# Patient Record
Sex: Female | Born: 1980 | Hispanic: Yes | Marital: Married | Smoking: Never smoker
Health system: Southern US, Community
[De-identification: ages and names within clinical notes are randomized; demographics above are authoritative.]

## PROBLEM LIST (undated history)

## (undated) ENCOUNTER — Inpatient Hospital Stay (HOSPITAL_COMMUNITY): Payer: Self-pay

## (undated) DIAGNOSIS — A749 Chlamydial infection, unspecified: Secondary | ICD-10-CM

## (undated) DIAGNOSIS — K831 Obstruction of bile duct: Secondary | ICD-10-CM

## (undated) DIAGNOSIS — O26619 Liver and biliary tract disorders in pregnancy, unspecified trimester: Secondary | ICD-10-CM

## (undated) DIAGNOSIS — O26649 Intrahepatic cholestasis of pregnancy, unspecified trimester: Secondary | ICD-10-CM

## (undated) DIAGNOSIS — D649 Anemia, unspecified: Secondary | ICD-10-CM

## (undated) HISTORY — PX: COLPOSCOPY: SHX161

---

## 2009-11-29 ENCOUNTER — Ambulatory Visit (HOSPITAL_COMMUNITY): Admission: RE | Admit: 2009-11-29 | Discharge: 2009-11-29 | Payer: Self-pay | Admitting: Family Medicine

## 2009-12-25 ENCOUNTER — Inpatient Hospital Stay (HOSPITAL_COMMUNITY): Admission: RE | Admit: 2009-12-25 | Discharge: 2009-12-25 | Payer: Self-pay | Admitting: Family Medicine

## 2009-12-27 ENCOUNTER — Ambulatory Visit (HOSPITAL_COMMUNITY): Admission: RE | Admit: 2009-12-27 | Discharge: 2009-12-27 | Payer: Self-pay | Admitting: Family Medicine

## 2010-01-21 ENCOUNTER — Ambulatory Visit: Payer: Self-pay | Admitting: Family Medicine

## 2010-01-26 ENCOUNTER — Observation Stay (HOSPITAL_COMMUNITY): Admission: AD | Admit: 2010-01-26 | Discharge: 2010-01-26 | Payer: Self-pay | Admitting: Obstetrics and Gynecology

## 2010-01-27 ENCOUNTER — Inpatient Hospital Stay (HOSPITAL_COMMUNITY)
Admission: AD | Admit: 2010-01-27 | Discharge: 2010-01-30 | Payer: Self-pay | Source: Home / Self Care | Admitting: Obstetrics & Gynecology

## 2010-01-27 ENCOUNTER — Ambulatory Visit: Payer: Self-pay | Admitting: Advanced Practice Midwife

## 2010-04-27 ENCOUNTER — Encounter: Payer: Self-pay | Admitting: Family Medicine

## 2010-06-18 LAB — CBC
Hemoglobin: 13.8 g/dL (ref 12.0–15.0)
Hemoglobin: 14 g/dL (ref 12.0–15.0)
MCHC: 34 g/dL (ref 30.0–36.0)
MCHC: 34.5 g/dL (ref 30.0–36.0)
RBC: 4.54 MIL/uL (ref 3.87–5.11)
RBC: 4.67 MIL/uL (ref 3.87–5.11)

## 2010-06-18 LAB — ABO/RH: ABO/RH(D): O POS

## 2010-06-18 LAB — RPR
RPR Ser Ql: NONREACTIVE
RPR Ser Ql: NONREACTIVE

## 2012-11-21 LAB — OB RESULTS CONSOLE HIV ANTIBODY (ROUTINE TESTING): HIV: NONREACTIVE

## 2012-11-21 LAB — OB RESULTS CONSOLE ABO/RH: RH TYPE: POSITIVE

## 2012-11-21 LAB — OB RESULTS CONSOLE ANTIBODY SCREEN: Antibody Screen: NEGATIVE

## 2012-11-21 LAB — OB RESULTS CONSOLE RPR: RPR: NONREACTIVE

## 2012-11-21 LAB — OB RESULTS CONSOLE GC/CHLAMYDIA
Chlamydia: NEGATIVE
Gonorrhea: NEGATIVE

## 2012-11-21 LAB — OB RESULTS CONSOLE RUBELLA ANTIBODY, IGM: RUBELLA: IMMUNE

## 2012-11-21 LAB — OB RESULTS CONSOLE HEPATITIS B SURFACE ANTIGEN: HEP B S AG: NEGATIVE

## 2012-12-12 ENCOUNTER — Inpatient Hospital Stay (HOSPITAL_COMMUNITY)
Admission: AD | Admit: 2012-12-12 | Discharge: 2012-12-12 | Disposition: A | Discharge status: Home / Self Care | Payer: Self-pay | Source: Ambulatory Visit | Attending: Obstetrics & Gynecology | Admitting: Obstetrics & Gynecology

## 2012-12-12 ENCOUNTER — Encounter (HOSPITAL_COMMUNITY): Payer: Self-pay | Admitting: *Deleted

## 2012-12-12 DIAGNOSIS — T63461A Toxic effect of venom of wasps, accidental (unintentional), initial encounter: Secondary | ICD-10-CM | POA: Insufficient documentation

## 2012-12-12 DIAGNOSIS — T6391XA Toxic effect of contact with unspecified venomous animal, accidental (unintentional), initial encounter: Secondary | ICD-10-CM | POA: Insufficient documentation

## 2012-12-12 DIAGNOSIS — Y92009 Unspecified place in unspecified non-institutional (private) residence as the place of occurrence of the external cause: Secondary | ICD-10-CM | POA: Insufficient documentation

## 2012-12-12 HISTORY — DX: Chlamydial infection, unspecified: A74.9

## 2012-12-12 NOTE — MAU Note (Signed)
Pt was stung by a bee on left lower leg one hour ago; she called the health dept and was told to come to [facility] to be checked.   Pt applied ice and then aloe vera to site, says it still burns.

## 2012-12-12 NOTE — MAU Provider Note (Signed)
  History     CSN: [id number]  Arrival date and time: 12/12/12 1510   First Provider Initiated Contact with Patient 12/12/12 1657      No chief complaint on file.  HPI [name] is a 32 y.o. female G2P1001 who presents with complaints of a bee sting to her left lower leg. She is receiving her care at the health department and they recommended her to come here for evaluation. The pt has been keeping Ice on the sting and feels it is getting better. She denies shortness of breath, difficulty breathing, or swelling in the face/ hands. This is the first time she has ever been stung by a bee; it happened approximately 3 hours ago.  OB History   Grav Para Term Preterm Abortions TAB SAB Ect Mult Living   2 1 1       1       Past Medical History  Diagnosis Date  . Chlamydia     History reviewed. No pertinent past surgical history.  Family History  Problem Relation Age of Onset  . Heart disease Father   . Asthma Paternal Grandmother     History  Substance Use Topics  . Smoking status: Never Smoker   . Smokeless tobacco: Not on file  . Alcohol Use: No    Allergies:  Allergies  Allergen Reactions  . Penicillins Rash    Prescriptions prior to admission  Medication Sig Dispense Refill  . Aloe Vera GEL Apply 1 application topically once. Applied to bee sting on leg.      . Prenatal Vit-Fe Fumarate-FA (PRENATAL MULTIVITAMIN) TABS tablet Take 1 tablet by mouth daily at 12 noon.        Review of Systems  Constitutional: Negative for fever, chills and malaise/fatigue.       At bee sting site   HENT: Negative for sore throat.   Eyes: Negative for blurred vision.  Respiratory: Negative for shortness of breath.   Cardiovascular: Positive for leg swelling. Negative for chest pain.       Leg lower leg swelling at bee sting site   Gastrointestinal: Negative for nausea and vomiting.  Skin: Positive for itching. Negative for rash.  Neurological: Negative for  dizziness, weakness and headaches.   Physical Exam   Last menstrual period 09/19/2012.  Physical Exam  Constitutional: She appears well-developed and well-nourished. No distress.  Neck: Neck supple.  Cardiovascular: Normal rate, regular rhythm and normal heart sounds.   Respiratory: Effort normal and breath sounds normal. No respiratory distress.  Musculoskeletal:       Left lower leg: She exhibits tenderness and swelling. She exhibits no edema and no deformity.       Legs: 1+ swelling in left lower extermitie at bee sting site   Skin: She is not diaphoretic.    MAU Course  Procedures None  MDM None  Assessment and Plan  A: Bee sting of left lower extremity   P: Discharge home Ok to continue Ice to the sting area If needed, ok to take benadryl or Claritin as directed on the bottle  Return to MAU with worsening symptoms Keep your appointment with the health department  Support given  Debbrah Alar FNP-C 12/12/2012, 5:16 PM

## 2013-04-06 NOTE — L&D Delivery Note (Signed)
.    Delivery Note  Pt progressed to 8cms on 2 mu/min of pitocin.  AROM with clear fluid around 2210 At 2237, after a 2 minute 2nd stage,   a viable female was delivered via  (Presentation:LOA ).  APGAR:9/9.  The baby had respiratory effort and vigorous cry whilst it's body was still undelivered.  The weight is pending.  40 units of pitocin diluted in 1000cc LR was infused rapidly IV.  The placenta separated spontaneously and delivered via CCT and maternal pushing effort.  It was inspected and appears to be intact with a 3 VC.  There were the following complications:   Anesthesia:none Episiotomy: none Lacerations: none Suture Repair: n/a Est. Blood Loss (mL): 150cc  Mom to postpartum.  Baby to Couplet care / Skin to Skin.

## 2013-04-06 NOTE — L&D Delivery Note (Signed)
Attestation of Attending Supervision of Advanced Practitioner (CNM/NP): Evaluation and management procedures were performed by the Advanced Practitioner under my supervision and collaboration.  I have reviewed the Advanced Practitioner's note and chart, and I agree with the management and plan.  HARRAWAY-SMITH, Rochanda Harpham 2:51 PM     

## 2013-06-13 ENCOUNTER — Encounter (HOSPITAL_COMMUNITY): Payer: Self-pay

## 2013-06-13 ENCOUNTER — Inpatient Hospital Stay (HOSPITAL_COMMUNITY)
Admission: AD | Admit: 2013-06-13 | Discharge: 2013-06-14 | Disposition: A | Discharge status: Home / Self Care | Payer: Self-pay | Source: Ambulatory Visit | Attending: Obstetrics & Gynecology | Admitting: Obstetrics & Gynecology

## 2013-06-13 DIAGNOSIS — O9989 Other specified diseases and conditions complicating pregnancy, childbirth and the puerperium: Principal | ICD-10-CM

## 2013-06-13 DIAGNOSIS — A088 Other specified intestinal infections: Secondary | ICD-10-CM | POA: Insufficient documentation

## 2013-06-13 DIAGNOSIS — A084 Viral intestinal infection, unspecified: Secondary | ICD-10-CM

## 2013-06-13 DIAGNOSIS — R109 Unspecified abdominal pain: Secondary | ICD-10-CM | POA: Insufficient documentation

## 2013-06-13 DIAGNOSIS — O99891 Other specified diseases and conditions complicating pregnancy: Secondary | ICD-10-CM | POA: Insufficient documentation

## 2013-06-13 LAB — OB RESULTS CONSOLE GBS: STREP GROUP B AG: NEGATIVE

## 2013-06-13 NOTE — MAU Note (Signed)
Watery diarrhea since 2 pm yesterday, vomited x 2. Some upper abdominal pain, feels like baby "balls up" when this happens; about 5-6 times per hour tonight. Denies vaginal bleeding or LOF. Positive fetal movement.

## 2013-06-14 DIAGNOSIS — A088 Other specified intestinal infections: Secondary | ICD-10-CM

## 2013-06-14 LAB — URINE MICROSCOPIC-ADD ON

## 2013-06-14 LAB — URINALYSIS, ROUTINE W REFLEX MICROSCOPIC
BILIRUBIN URINE: NEGATIVE
GLUCOSE, UA: NEGATIVE mg/dL
Ketones, ur: NEGATIVE mg/dL
Leukocytes, UA: NEGATIVE
Nitrite: NEGATIVE
PROTEIN: NEGATIVE mg/dL
Urobilinogen, UA: 0.2 mg/dL (ref 0.0–1.0)
pH: 6 (ref 5.0–8.0)

## 2013-06-14 NOTE — Discharge Instructions (Signed)
Dieta para la diarrea - Adultos   (Diet for Diarrhea, Adult)   Las deposiciones acuosas frecuentes (diarrea) pueden ser originados o empeorar por algunos alimentos o bebidas. La diarrea puede mejorarse con un cambio en la dieta. Como la diarrea puede durar hasta 7 días, es fácil perder mucha cantidad de líquidos del organismo y deshidratarse. Los líquidos que se pierden deben reponerse. Junto con una dieta equilibrada y beber una gran cantidad de líquido para mantener la orina de tono claro o color amarillo pálido.  INSTRUCCIONES PARA LA DIETA   · Asegure una adecuada ingesta de líquidos (hidratación). Evite los líquidos que contengan azúcares simples o las bebidas deportivas, los jugos de frutas, los productos derivados de la leche entera y las gaseosas. Si bebe la cantidad suficiente de líquidos, la orina debe ser clara o amarillo pálido. Una solución de rehidratación oral se puede comprar en las farmacias, en las tiendas minoristas y por Internet. Se puede preparar una solución de rehidratación oral casera con los siguientes ingredientes:  ·    cucharadita de sal.  · ¾ cucharadita de bicarbonato.  ·  de cucharadita de sal sustituta (cloruro de potasio).  · 1  cucharada de azúcar.  · 1l (34 onzas) de agua.  · Ciertos alimentos y bebidas pueden aumentar la velocidad a la que el alimento progresa a través del tracto gastrointestinal (GI). Estos alimentos y bebidas deben evitarse e incluyen:  · Bebidas alcohólicas y con cafeína.  · Alimentos ricos en fibra, como frutas y verduras, nueces, semillas, panes y cereales integrales.  · Alimentos y bebidas endulzados con alcoholes de azúcar, tales como xilitol, sorbitol, y manitol.  · Algunos alimentos pueden ser bien tolerados y puede ayudar a espesar las heces, incluyendo:  · Alimentos con almidón, como arroz, pan, pasta, cereales bajos en azúcar, avena, sémola de maíz, papas al horno, galletas y panecillos.    · Bananas.    · Puré de manzana.  · Agregue alimentos ricos  en probióticos a la dieta del niño para ayudar a aumentar las bacterias saludables en el tracto gastrointestinal, como el yogur y productos lácteos fermentados.  ALIMENTOS Y BEBIDAS RECOMENDADOS   Féculas  Alimentos con menos de 2 g de fibra por porción.   · Recomendados:  Pan blanco, francés, pita, bollos y rosquillas. Muffins, pan ácimo. Galletas de agua, saladas o de graham. Pretzel, biscotes, bizcochos. Maíz refinado, trigo, arroz. Patatas preparadas de cualquier modo sin piel, macaroni, espaghetti, fideos, arroz refinado.  · Evite:  Pan, bollos o galletas preparadas con trigo entero, multigranos, salvado, semillas, frutos secos o coco. Tortillas de maíz o tacos. Cereales que contengan granos enteros, multigranos, salvado, coco, frutos secos o pasas de uva. Harina de avena cocida o seca. Cereales de grano grueso, granola. Cereales promocionados como con "alto contenido de fibra". Cáscara de patatas. Pasta integral, arroz marrón, arroz salvaje. Palomitas de maíz. Batatas. Panecillos dulces, donas, panqueques, waffles, pan Sela.  Vegetales  · Recomendados: Jugo de tomates o de vegetales. Vegetales bien cocidos o enlatados sin semillas. Frescos Lechuga tierna, pepino sin cáscara, repollos, espinacas, brotes de soja.  · Evite: Frescos, cocidos o enlatados: [place]ofas, porotos, remolacha, bróccoli, repollitos de [place]us[place], maíz, coles, legumbres, arvejas, batatas. Cocidos: Repollo verde o rojo, espinacas. Evite las porciones grandes de cualquier vegetal, debido a que los vegetales disminuyen su tamaño al cocinarlos y contienen más fibras por porción.  Frutas  · Recomendados: Cocidas o enlatadas: Duraznos, puré de manzanas, melón, cerezas, cóctel de frutas, pomelo, uvas, kiwi, naranjas, melocotón, pera, ciruelas,   sandías. Frescos: Manzanas sin la piel, banana madura, uvas, melón, cerezas, pomelo, duraznos, naranjas, ciruelas. Limite las porciones a ½ taza o1 unidad.  · Evite: Frescos: Manzana con piel, damasco, mango,  pera, frambuesa, frutillas. Jugo de ciruela, compota o ciruelas secas. Frutas secas, pasas de uva, dátiles. Evite porciones grandes de todas las frutas frescas.  Proteínas  · Recomendados: Carne molida o un bife tierno bien cocido, jamón, ternera, cordero, cerdo o aves. Huevos. pescado, ostra, langostinos, langosta, frutos de mar. Hígado y otros órganos.  · Evite: Carnes duras y fibrosas con cartílago. Mantequilla de maní, suave o entera. Queso, frutos secos, semillas, legumbres, arvejas secas, lentejas.  Lácteos:  · Recomendados: Yogur, leche sin lactosa, kéfir, yogur bebible, suero de leche, leche de soja, o queso duro común.  · Evite: Leche, leche con chocolate, bebidas a base de leche, tales como batidos.  Sopas  · Recomendados: Consomé, caldo o sopas hechas con los alimentos permitidos. Cualquier sopa colada.  · Evite: Sopas hechas con vegetales no permitidos, sopas basadas en cremas o leche.  Postres y dulces  · Recomendados: Gelatina sin azúcar, helados de agua sin azúcar.  · Evite: Tortas y masitas, pasteles hechos con frutas permitidas, budines, natillas, pasteles con crema. Gelatina, frutas, hielo, sorbetes, helados de agua. Helados, batidos sin frutos secos. Caramelos duros, miel, gelatina, melaza, jarabes, azúcar, jarabe de chocolate, pastillas de goma, malvaviscos.  Grasas y aceites  · Recomendados: Limite las grasas a menos de 8 cucharaditas por día.  · Evite: Semillas, frutos secos, aceitunas, paltas. Margarina, manteca, crema, mayonesa, aceites para ensaladas, aderezos para ensaladas. Salsas, tocino sin corteza.  Bebidas  · Recomendados: Agua, tés descafeinados, soluciones de rehidratación oral, bebidas sin azúcar no endulzados con alcoholes de azúcar.  · Evite: Los jugos de frutas, bebidas con cafeína (café, té, refrescos), bebidas alcohólicas, bebidas deportivas, o gaseosa lima-limón.  Condimentos  · Recomendados: Ketchup, mostaza, rábano picante, el vinagre, el cacao en polvo. Especias con  moderación: pimienta de [place], alb[place]aca, laurel, polvo u hojas de apio, canela, comino en polvo, polvo de curry, jengibre, macis, mejorana, cebolla o ajo en polvo, orégano, pimentón, perejil, pimienta, romero, salvia, ajedrea, estragón, tomillo, cúrcuma.  · Evite: Coco, miel.  Document Released: 03/23/2005 Document Revised: 12/16/2011  ExitCare® Patient Information ©2014 [employer].

## 2013-06-14 NOTE — MAU Provider Note (Signed)
Attestation of Attending Supervision of Advanced Practitioner (CNM/NP): Evaluation and management procedures were performed by the Advanced Practitioner under my supervision and collaboration.  I have reviewed the Advanced Practitioner's note and chart, and I agree with the management and plan.  HARRAWAY-SMITH, Marquise Lambson 6:49 AM     

## 2013-06-14 NOTE — MAU Provider Note (Signed)
History     CSN: [id number]  Arrival date and time: 06/13/13 2327   First Provider Initiated Contact with Patient 06/14/13 0017      Chief Complaint  Patient presents with  . Emesis  . Diarrhea  . Abdominal Pain   Emesis  Associated symptoms include abdominal pain and diarrhea.  Diarrhea  Associated symptoms include abdominal pain and vomiting.  Abdominal Pain Associated symptoms include diarrhea and vomiting.    Brittney Floyd is a 33 y.o. G2P1001 at [redacted]w[redacted]d who presents today with diarrhea. She states that it started yesterday at 1400, and she has had 8+ episodes since then. She denies any vomiting, but she has had some occasional abdominal pain. She states that the baby has been moving normally. She was seen at the HD on Monday and was 1cm at that visit.   Past Medical History  Diagnosis Date  . Chlamydia   . Medical history non-contributory     Past Surgical History  Procedure Laterality Date  . No past surgeries      Family History  Problem Relation Age of Onset  . Heart disease Father   . Asthma Paternal Grandmother     History  Substance Use Topics  . Smoking status: Never Smoker   . Smokeless tobacco: Not on file  . Alcohol Use: No    Allergies:  Allergies  Allergen Reactions  . Penicillins Rash    Prescriptions prior to admission  Medication Sig Dispense Refill  . Prenatal Vit-Fe Fumarate-FA (PRENATAL MULTIVITAMIN) TABS tablet Take 1 tablet by mouth daily at 12 noon.      . Aloe Vera GEL Apply 1 application topically once. Applied to bee sting on leg.        Review of Systems  Gastrointestinal: Positive for vomiting, abdominal pain and diarrhea.   Physical Exam   Blood pressure 127/75, pulse 83, temperature 98 F (36.7 C), temperature source Oral, resp. rate 18, height 5\' 1"  (1.549 m), weight 75.751 kg (167 lb), last menstrual period 09/19/2012.  Physical Exam  Nursing note and vitals reviewed. Constitutional: She is oriented to  person, place, and time. She appears well-developed and well-nourished. No distress.  Cardiovascular: Normal rate.   Respiratory: Effort normal.  GI: Soft. There is no tenderness.  Genitourinary:   Cervix: 1/thick/-2/soft  Neurological: She is alert and oriented to person, place, and time.  Skin: Skin is warm and dry.  Psychiatric: She has a normal mood and affect.    MAU Course  Procedures  Results for orders placed during the hospital encounter of 06/13/13 (from the past 24 hour(s))  URINALYSIS, ROUTINE W REFLEX MICROSCOPIC     Status: Abnormal   Collection Time    06/13/13 11:37 PM      Result Value Ref Range   Color, Urine YELLOW  YELLOW   APPearance CLEAR  CLEAR   Specific Gravity, Urine <1.005 (*) 1.005 - 1.030   pH 6.0  5.0 - 8.0   Glucose, UA NEGATIVE  NEGATIVE mg/dL   Hgb urine dipstick TRACE (*) NEGATIVE   Bilirubin Urine NEGATIVE  NEGATIVE   Ketones, ur NEGATIVE  NEGATIVE mg/dL   Protein, ur NEGATIVE  NEGATIVE mg/dL   Urobilinogen, UA 0.2  0.0 - 1.0 mg/dL   Nitrite NEGATIVE  NEGATIVE   Leukocytes, UA NEGATIVE  NEGATIVE  URINE MICROSCOPIC-ADD ON     Status: Abnormal   Collection Time    06/13/13 11:37 PM      Result Value Ref Range  Squamous Epithelial / LPF RARE  RARE   WBC, UA 0-2  <3 WBC/hpf   RBC / HPF 0-2  <3 RBC/hpf   Bacteria, UA FEW (*) RARE     Assessment and Plan   1. Viral gastroenteritis    BRAT diet Labor precautions Fetal kick counts Return to MAU as needed  Follow-up Information   [facility] [place]. (As scheduled)    Contact information:   [address] [place] [id number] [phone]       Tawnya CrookHogan, Heather Donovan 06/14/2013, 12:29 AM

## 2013-06-15 ENCOUNTER — Encounter (HOSPITAL_COMMUNITY): Payer: Self-pay | Admitting: *Deleted

## 2013-06-15 ENCOUNTER — Inpatient Hospital Stay (HOSPITAL_COMMUNITY)
Admission: AD | Admit: 2013-06-15 | Discharge: 2013-06-15 | Disposition: A | Discharge status: Home / Self Care | Payer: Self-pay | Source: Ambulatory Visit | Attending: Obstetrics & Gynecology | Admitting: Obstetrics & Gynecology

## 2013-06-15 DIAGNOSIS — R51 Headache: Secondary | ICD-10-CM | POA: Insufficient documentation

## 2013-06-15 DIAGNOSIS — K5289 Other specified noninfective gastroenteritis and colitis: Secondary | ICD-10-CM

## 2013-06-15 DIAGNOSIS — K529 Noninfective gastroenteritis and colitis, unspecified: Secondary | ICD-10-CM

## 2013-06-15 DIAGNOSIS — O9989 Other specified diseases and conditions complicating pregnancy, childbirth and the puerperium: Principal | ICD-10-CM

## 2013-06-15 DIAGNOSIS — O99891 Other specified diseases and conditions complicating pregnancy: Secondary | ICD-10-CM | POA: Insufficient documentation

## 2013-06-15 DIAGNOSIS — A088 Other specified intestinal infections: Secondary | ICD-10-CM | POA: Insufficient documentation

## 2013-06-15 LAB — URINALYSIS, ROUTINE W REFLEX MICROSCOPIC
BILIRUBIN URINE: NEGATIVE
Glucose, UA: NEGATIVE mg/dL
Hgb urine dipstick: NEGATIVE
KETONES UR: NEGATIVE mg/dL
LEUKOCYTES UA: NEGATIVE
NITRITE: NEGATIVE
Protein, ur: NEGATIVE mg/dL
Specific Gravity, Urine: 1.02 (ref 1.005–1.030)
UROBILINOGEN UA: 0.2 mg/dL (ref 0.0–1.0)
pH: 6 (ref 5.0–8.0)

## 2013-06-15 MED ORDER — PROMETHAZINE HCL 12.5 MG PO TABS: 12.5000 mg | ORAL_TABLET | Freq: Four times a day (QID) | ORAL | Status: DC | PRN

## 2013-06-15 MED ORDER — LOPERAMIDE HCL 2 MG PO CAPS: 2.0000 mg | ORAL_CAPSULE | ORAL | Status: DC | PRN

## 2013-06-15 NOTE — Discharge Instructions (Signed)
Gastroenteritis viral  (Viral Gastroenteritis)  La gastroenteritis viral también es conocida como gripe del estómago. Este trastorno afecta el estómago y el tubo digestivo. Puede causar diarrea y vómitos repentinos. La enfermedad generalmente dura entre 3 y 8 días. La mayoría de las personas desarrolla una respuesta inmunológica. Con el tiempo, esto elimina el virus. Mientras se desarrolla esta respuesta natural, el virus puede afectar en forma importante su salud.   CAUSAS  Muchos virus diferentes pueden causar gastroenteritis, por ejemplo el rotavirus o el norovirus. Estos virus pueden contagiarse al consumir alimentos o agua contaminados. También puede contagiarse al compartir utensilios u otros artículos personales con una persona infectada o al tocar una superficie contaminada.   SÍNTOMAS  Los síntomas más comunes son diarrea y vómitos. Estos problemas pueden causar una pérdida grave de líquidos corporales(deshidratación) y un desequilibrio de sales corporales(electrolitos). Otros síntomas pueden ser:   · Fiebre.  · Dolor de cabeza.  · Fatiga.  · Dolor abdominal.  DIAGNÓSTICO   El médico podrá hacer el diagnóstico de gastroenteritis viral basándose en los síntomas y el examen físico También pueden tomarle una muestra de materia fecal para diagnosticar la presencia de virus u otras infecciones.   TRATAMIENTO  Esta enfermedad generalmente desaparece sin tratamiento. Los tratamientos están dirigidos a la rehidratación. Los casos más graves de gastroenteritis viral implican vómitos tan intensos que no es posible retener líquidos. En estos casos, los líquidos deben administrarse a través de una vía intravenosa (IV).   INSTRUCCIONES PARA EL CUIDADO DOMICILIARIO  · Beba suficientes líquidos para mantener la orina clara o de color amarillo pálido. Beba pequeñas cantidades de líquido con frecuencia y aumente la cantidad según la tolerancia.  · Pida instrucciones específicas a su médico con respecto a la  rehidratación.  · Evite:  · Alimentos que tengan mucha azúcar.  · Alcohol.  · Gaseosas.  · Tabaco.  · Jugos.  · Bebidas con cafeína.  · Líquidos muy calientes o fríos.  · Alimentos muy grasos.  · Comer demasiado a la vez.  · Productos lácteos hasta 24 a 48 horas después de que se detenga la diarrea.  · Puede consumir probióticos. Los probióticos son cultivos activos de bacterias beneficiosas. Pueden disminuir la cantidad y el número de deposiciones diarreicas en el adulto. Se encuentran en los yogures con cultivos activos y en los suplementos.  · Lave bien sus manos para evitar que se disemine el virus.  · Sólo tome medicamentos de venta libre o recetados para calmar el dolor, las molestias o bajar la fiebre según las indicaciones de su médico. No administre aspirina a los niños. Los medicamentos antidiarreicos no son recomendables.  · Consulte a su médico si puede seguir tomando sus medicamentos recetados o de venta libre.  · Cumpla con todas las visitas de control, según le indique su médico.  SOLICITE ATENCIÓN MÉDICA DE INMEDIATO SI:  · No puede retener líquidos.  · No hay emisión de orina durante 6 a 8 horas.  · Le falta el aire.  · Observa sangre en el vómito (se ve como café molido) o en la materia fecal.  · Siente dolor abdominal que empeora o se concentra en una zona pequeña (se localiza).  · Tiene náuseas o vómitos persistentes.  · Tiene fiebre.  · El paciente es un niño menor de 3 meses y tiene fiebre.  · El paciente es un niño mayor de 3 meses, tiene fiebre y síntomas persistentes.  · El paciente es un niño mayor de 3 meses   y tiene fiebre y síntomas que empeoran repentinamente.  · El paciente es un bebé y no tiene lágrimas cuando llora.  ASEGÚRESE QUE:   · Comprende estas instrucciones.  · Controlará su enfermedad.  · Solicitará ayuda inmediatamente si no mejora o si empeora.  Document Released: 03/23/2005 Document Revised: 06/15/2011  ExitCare® Patient Information ©2014 [employer].

## 2013-06-15 NOTE — MAU Provider Note (Signed)
History     CSN: [id number]  Arrival date and time: 06/15/13 1422   None     No chief complaint on file.  HPI  Pt is a 33 yo G2P1001 currently pregnant female at [redacted]w[redacted]d presenting with 3 days of consistent diarrhea and some mild abdominal pain. Pt relates that for the past three days she has been experiencing profuse, watery, non-bloody diarrhea. Pt was evaluated for this, 2 days ago in the MAU and was diagnosed with viral gastroenteritis and discharged with instructions to be on the BRAT diet. At this time she had been experiencing emesis which has since resolved. Pt has been compliant with the BRAT diet but states that she has experienced continued diarrhea. She has taken no medications.  Pt states that she has developed some mild waxing and waning upper abdominal pains over this time as well. Pt does not feel like these are contractions. [facility] today and was advised to come to the MAU for evaluation of this.  Pt has no sick contacts. She admits to eating a "[place] sandwich" that others had not eaten prior to the onset of symptoms, and feels that this is what caused her illness. Pt reports only a mild headache for the past day, but no visual changes. No edema. No cough, myalgias, or nasal congestions.  She has no vaginal bleeding or fluid leakage. Fetal movement is present, however she states that it has been less since she has been ill.   Past Medical History  Diagnosis Date  . Chlamydia   . Medical history non-contributory     Past Surgical History  Procedure Laterality Date  . No past surgeries      Family History  Problem Relation Age of Onset  . Heart disease Father   . Asthma Paternal Grandmother     History  Substance Use Topics  . Smoking status: Never Smoker   . Smokeless tobacco: Not on file  . Alcohol Use: No    Allergies:  Allergies  Allergen Reactions  . Penicillins Rash    Prescriptions prior to admission  Medication Sig  Dispense Refill  . Prenatal Vit-Fe Fumarate-FA (PRENATAL MULTIVITAMIN) TABS tablet Take 1 tablet by mouth daily at 12 noon.        Review of Systems  Constitutional: Negative for fever, chills and malaise/fatigue.  HENT: Negative for congestion and sore throat.   Eyes: Negative for blurred vision and double vision.  Respiratory: Negative for cough, sputum production and shortness of breath.   Cardiovascular: Negative for chest pain and palpitations.  Gastrointestinal: Positive for abdominal pain and diarrhea. Negative for heartburn and nausea.  Genitourinary: Negative for dysuria and urgency.  Musculoskeletal: Negative for myalgias.  Neurological: Positive for headaches. Negative for dizziness, tingling and weakness.   Physical Exam   Last menstrual period 09/19/2012.  Physical Exam  Constitutional: She appears well-developed and well-nourished. No distress.  Cardiovascular: Normal rate, regular rhythm and normal heart sounds.   Respiratory: Effort normal and breath sounds normal. No respiratory distress. She has no wheezes. She has no rales.  GI: Soft. There is no tenderness. There is no rebound and no guarding.   FHT: 140s mod var mult 15x15 accels, no decels Toco: no ctx  MAU Course  Procedures  MDM Stable vitals, tolerating PO. No labs today  Assessment and Plan  Brittney Floyd is a 33 y.o. G2P1001 at [redacted]w[redacted]d with acute viral gastroenteritis. Will tx symptomatically with immodium and phenergan. APAP for pain. Pt  feels to be improving. Discharge home with return precautions for blood in stool, inability to tolerate PO, or fever. Reassuring and reactive fetal status. F/u with HD on monday  Winkel, Bradley T 06/15/2013, 3:30 PM   I spoke with and examined patient and agree with PA-S's note and plan of care.  Tawana ScaleMichael Ryan Zacharias Ridling, MD Ob Fellow 06/15/2013 4:25 PM

## 2013-06-15 NOTE — MAU Note (Signed)
Vomiting & diarrhea started Monday, seen in MAU on Tuesday night, sx's continue.  Pain in upper abdomen.  Denies fever, cough, or sore throat.

## 2013-06-16 ENCOUNTER — Telehealth (HOSPITAL_COMMUNITY): Payer: Self-pay | Admitting: *Deleted

## 2013-06-16 NOTE — MAU Provider Note (Signed)
Attestation of Attending Supervision of Advanced Practitioner (PA/CNM/NP): Evaluation and management procedures were performed by the Advanced Practitioner under my supervision and collaboration.  I have reviewed the Advanced Practitioner's note and chart, and I agree with the management and plan.  Royale Swamy, MD, FACOG Attending Obstetrician & Gynecologist Faculty Practice, [facility]  

## 2013-06-26 ENCOUNTER — Other Ambulatory Visit (HOSPITAL_COMMUNITY): Payer: Self-pay | Admitting: Nurse Practitioner

## 2013-06-26 DIAGNOSIS — O480 Post-term pregnancy: Secondary | ICD-10-CM

## 2013-06-29 ENCOUNTER — Telehealth (HOSPITAL_COMMUNITY): Payer: Self-pay | Admitting: *Deleted

## 2013-06-29 ENCOUNTER — Inpatient Hospital Stay (HOSPITAL_COMMUNITY)
Admission: AD | Admit: 2013-06-29 | Discharge: 2013-06-30 | DRG: 775 | Disposition: A | Discharge status: Home / Self Care | Payer: Medicaid Other | Source: Ambulatory Visit | Attending: Obstetrics & Gynecology | Admitting: Obstetrics & Gynecology

## 2013-06-29 ENCOUNTER — Encounter (HOSPITAL_COMMUNITY): Payer: Self-pay | Admitting: *Deleted

## 2013-06-29 DIAGNOSIS — K838 Other specified diseases of biliary tract: Secondary | ICD-10-CM | POA: Diagnosis present

## 2013-06-29 DIAGNOSIS — O9989 Other specified diseases and conditions complicating pregnancy, childbirth and the puerperium: Secondary | ICD-10-CM

## 2013-06-29 DIAGNOSIS — O26613 Liver and biliary tract disorders in pregnancy, third trimester: Secondary | ICD-10-CM

## 2013-06-29 DIAGNOSIS — O99892 Other specified diseases and conditions complicating childbirth: Secondary | ICD-10-CM

## 2013-06-29 DIAGNOSIS — K831 Obstruction of bile duct: Secondary | ICD-10-CM | POA: Diagnosis present

## 2013-06-29 LAB — CBC
HCT: 36.4 % (ref 36.0–46.0)
Hemoglobin: 12.3 g/dL (ref 12.0–15.0)
MCH: 26.6 pg (ref 26.0–34.0)
MCHC: 33.8 g/dL (ref 30.0–36.0)
MCV: 78.6 fL (ref 78.0–100.0)
Platelets: 295 10*3/uL (ref 150–400)
RBC: 4.63 MIL/uL (ref 3.87–5.11)
RDW: 13.6 % (ref 11.5–15.5)
WBC: 12 10*3/uL — ABNORMAL HIGH (ref 4.0–10.5)

## 2013-06-29 LAB — TYPE AND SCREEN
ABO/RH(D): O POS
Antibody Screen: NEGATIVE

## 2013-06-29 LAB — RPR: RPR Ser Ql: NONREACTIVE

## 2013-06-29 MED ORDER — OXYCODONE-ACETAMINOPHEN 5-325 MG PO TABS: 1.0000 | ORAL_TABLET | ORAL | Status: DC | PRN

## 2013-06-29 MED ORDER — CITRIC ACID-SODIUM CITRATE 334-500 MG/5ML PO SOLN: 30.0000 mL | ORAL | Status: DC | PRN

## 2013-06-29 MED ORDER — FENTANYL CITRATE 0.05 MG/ML IJ SOLN: 50.0000 ug | INTRAMUSCULAR | Status: DC | PRN

## 2013-06-29 MED ORDER — TERBUTALINE SULFATE 1 MG/ML IJ SOLN: 0.2500 mg | Freq: Once | INTRAMUSCULAR | Status: AC | PRN

## 2013-06-29 MED ORDER — LACTATED RINGERS IV SOLN: 500.0000 mL | INTRAVENOUS | Status: DC | PRN

## 2013-06-29 MED ORDER — OXYTOCIN 40 UNITS IN LACTATED RINGERS INFUSION - SIMPLE MED
1.0000 m[IU]/min | INTRAVENOUS | Status: DC
Start: 2013-06-29 — End: 2013-06-30
  Administered 2013-06-29: 2 m[IU]/min via INTRAVENOUS
  Filled 2013-06-29: qty 1000

## 2013-06-29 MED ORDER — MISOPROSTOL 25 MCG QUARTER TABLET
25.0000 ug | ORAL_TABLET | ORAL | Status: DC | PRN
  Administered 2013-06-29 (×2): 25 ug via VAGINAL
  Filled 2013-06-29 (×2): qty 0.25

## 2013-06-29 MED ORDER — OXYTOCIN BOLUS FROM INFUSION: 500.0000 mL | INTRAVENOUS | Status: DC

## 2013-06-29 MED ORDER — IBUPROFEN 600 MG PO TABS: 600.0000 mg | ORAL_TABLET | Freq: Four times a day (QID) | ORAL | Status: DC | PRN

## 2013-06-29 MED ORDER — OXYTOCIN 40 UNITS IN LACTATED RINGERS INFUSION - SIMPLE MED: 62.5000 mL/h | INTRAVENOUS | Status: DC

## 2013-06-29 MED ORDER — LACTATED RINGERS IV SOLN
INTRAVENOUS | Status: DC
  Administered 2013-06-29: 125 mL/h via INTRAVENOUS

## 2013-06-29 MED ORDER — HYDROXYZINE HCL 25 MG PO TABS
25.0000 mg | ORAL_TABLET | Freq: Four times a day (QID) | ORAL | Status: DC | PRN
  Filled 2013-06-29: qty 1

## 2013-06-29 MED ORDER — LIDOCAINE HCL (PF) 1 % IJ SOLN
30.0000 mL | INTRAMUSCULAR | Status: DC | PRN
  Filled 2013-06-29: qty 30

## 2013-06-29 MED ORDER — ONDANSETRON HCL 4 MG/2ML IJ SOLN: 4.0000 mg | Freq: Four times a day (QID) | INTRAMUSCULAR | Status: DC | PRN

## 2013-06-29 MED ORDER — ACETAMINOPHEN 325 MG PO TABS: 650.0000 mg | ORAL_TABLET | ORAL | Status: DC | PRN

## 2013-06-29 NOTE — Progress Notes (Signed)
Brittney Floyd is a 33 y.o. G2P1001 at 5965w3d by LMP admitted for induction of labor due to cholestasis (bile acids = 25).  Subjective: Doing well; no complaints. Starting to feel some ctxs.   Objective: BP 125/80  Pulse 65  Temp(Src) 97.9 F (36.6 C) (Oral)  Resp 16  Ht 5\' 1"  (1.549 m)  Wt 77.111 kg (170 lb)  BMI 32.14 kg/m2  LMP 09/19/2012     FHT:  FHR: 150 bpm, variability: moderate,  accelerations:  Present,  decelerations:  Absent UC:   irregular SVE:   Dilation: 2 Effacement (%): 60 Station: -2 Exam by:: S Grindstaff RN  Labs: Lab Results  Component Value Date   WBC 12.0* 06/29/2013   HGB 12.3 06/29/2013   HCT 36.4 06/29/2013   MCV 78.6 06/29/2013   PLT 295 06/29/2013   Assessment / Plan: induction of labor due to cholestasis (bile acids = 25).  Labor: Progressing normally and FB placed @ 7pm s/p Cytotec x 2; Will start Pitocin in 4 hours Preeclampsia:  na Fetal Wellbeing:  Category I Pain Control:  Labor support without medications I/D:  GBS neg Anticipated MOD:  NSVD  Brittney Floyd, Brittney Floyd 06/29/2013, 6:57 PM

## 2013-06-29 NOTE — Progress Notes (Signed)
Maternal abdomin has little tone making it difficult to trace fhr.  [facility] applied

## 2013-06-29 NOTE — H&P (Signed)
LABOR ADMISSION HISTORY AND PHYSICAL   Brittney Floyd is a 33 y.o. female G2P1001 with IUP at 6143w3d by LMP c/w 19 week US presenting for IOL for cholestasis. Pt has been doing well throughout pregnancy and was seen for routine visit three days ago and complained of itching so bile salts were checked and were elevated at 25. Had had itching for 2 weeks prior to this. Thus, she was sent over here for IOL. No ctx, lof, vb. +FM   [facility]are at [facility] since early pregnancy.  Pregnancy uncomplicated up until this visit and the elevated bile salts. GBS neg. Normal PNL. US normal at Dr. Elsie StainMarshall's office.   Planning to breast feed and wanting depo for contraception.    Prenatal History/Complications:  Past Medical History: Past Medical History  Diagnosis Date  . Chlamydia   . Medical history non-contributory     Past Surgical History: Past Surgical History  Procedure Laterality Date  . No past surgeries      Obstetrical History: OB History   Grav Para Term Preterm Abortions TAB SAB Ect Mult Living   2 1 1       1      G1- NSVD, 5lb 8oz  Social History: History   Social History  . Marital Status: Married    Spouse Name: N/A    Number of Children: N/A  . Years of Education: N/A   Social History Main Topics  . Smoking status: Never Smoker   . Smokeless tobacco: None  . Alcohol Use: No  . Drug Use: No  . Sexual Activity: Yes   Other Topics Concern  . None   Social History Narrative  . None    Family History: Family History  Problem Relation Age of Onset  . Heart disease Father   . Asthma Paternal Grandmother     Allergies: Allergies  Allergen Reactions  . Penicillins Rash    Prescriptions prior to admission  Medication Sig Dispense Refill  . loperamide (IMODIUM) 2 MG capsule Take 1 capsule (2 mg total) by mouth as needed for diarrhea or loose stools.  30 capsule  0  . Prenatal Vit-Fe Fumarate-FA (PRENATAL MULTIVITAMIN) TABS tablet Take 1 tablet by mouth  daily at 12 noon.      . promethazine (PHENERGAN) 12.5 MG tablet Take 1 tablet (12.5 mg total) by mouth every 6 (six) hours as needed for nausea or vomiting.  30 tablet  0     Review of Systems  All systems reviewed and negative except as stated in HPI    Blood pressure 117/82, pulse 96, temperature 98.4 F (36.9 C), temperature source Oral, resp. rate 18, height 5\' 1"  (1.549 m), weight 77.111 kg (170 lb), last menstrual period 09/19/2012. General appearance: alert, cooperative and no distress Lungs: clear to auscultation bilaterally Heart: regular rate and rhythm Abdomen: soft, non-tender; bowel sounds normal Extremities: Homans sign is negative, no sign of DVT  Presentation: cephalic Fetal monitoringBaseline: 150s bpm, Variability: Good {> 6 bpm), Accelerations: Reactive and Decelerations: Absent Uterine activity irritability   Dilation: 2 Effacement (%): 30 Station: -2 Exam by:: Dr Gayla DossJoyner   Prenatal labs: ABO, Rh: O/Positive/-- (08/18 0000) Antibody: Negative (08/18 0000) Rubella:   RPR: Nonreactive (08/18 0000)  HBsAg: Negative (08/18 0000)  HIV: Non-reactive (08/18 0000)  GBS: Negative (03/10 0000)  1 hr Glucola 79 Genetic screening neg Anatomy US     Assessment: Brittney Floyd is a 33 y.o. G2P1001 with an IUP at 4643w3d presenting for IOL  at term for cholestasis   Plan: 1) admit to L&D - routine orders.  - planning natural child birth  2) IOL for cholestasis - bishops of 4 - start with cytotec and then likely FB and plan for pit after  3) FWB - cat I tracing -GBS neg - efw 6.5-7lbs  4) anticipate SVD - breast feeding - depo for contraception   Carr Shartzer L, MD 06/29/2013, 1:02 PM

## 2013-06-29 NOTE — H&P (Signed)
Attestation of Attending Supervision of Fellow: Evaluation and management procedures were performed by the Fellow under my supervision and collaboration.  I have reviewed the Fellow's note and chart, and I agree with the management and plan.    

## 2013-06-29 NOTE — Progress Notes (Signed)
   Brittney Floyd is a 33 y.o. G2P1001 at 6776w3d  admitted for induction of labor due to cholestasis.  Subjective: Not itching now.  Feeling contractions as mild  Objective: BP 125/80  Pulse 65  Temp(Src) 98.2 F (36.8 C) (Oral)  Resp 16  Ht 5\' 1"  (1.549 m)  Wt 77.111 kg (170 lb)  BMI 32.14 kg/m2  LMP 09/19/2012    FHT:  FHR: 150 bpm, variability: moderate,  accelerations:  Present,  decelerations:  Absent UC:   irregular, every 3-6 minutes SVE:   Dilation: 5.5 Effacement (%): 80 Station: -2 Exam by:: Brittney Floyd, CNM   Labs: Lab Results  Component Value Date   WBC 12.0* 06/29/2013   HGB 12.3 06/29/2013   HCT 36.4 06/29/2013   MCV 78.6 06/29/2013   PLT 295 06/29/2013    Assessment / Plan: IOL for cholestasis, ripening phase complete WIll start pit at 2130 (4 hours after cytotec) Labor: no Fetal Wellbeing:  Category I Pain Control:  none Anticipated MOD:  NSVD  Floyd,Brittney Biondo 06/29/2013, 8:58 PM

## 2013-06-30 ENCOUNTER — Ambulatory Visit (HOSPITAL_COMMUNITY): Admission: RE | Admit: 2013-06-30 | Payer: Self-pay | Source: Ambulatory Visit

## 2013-06-30 MED ORDER — FLEET ENEMA 7-19 GM/118ML RE ENEM: 1.0000 | ENEMA | Freq: Every day | RECTAL | Status: DC | PRN

## 2013-06-30 MED ORDER — METHYLERGONOVINE MALEATE 0.2 MG/ML IJ SOLN: 0.2000 mg | INTRAMUSCULAR | Status: DC | PRN

## 2013-06-30 MED ORDER — BENZOCAINE-MENTHOL 20-0.5 % EX AERO: 1.0000 "application " | INHALATION_SPRAY | CUTANEOUS | Status: DC | PRN

## 2013-06-30 MED ORDER — OXYTOCIN 40 UNITS IN LACTATED RINGERS INFUSION - SIMPLE MED: 62.5000 mL/h | INTRAVENOUS | Status: DC | PRN

## 2013-06-30 MED ORDER — TETANUS-DIPHTH-ACELL PERTUSSIS 5-2.5-18.5 LF-MCG/0.5 IM SUSP: 0.5000 mL | Freq: Once | INTRAMUSCULAR | Status: DC

## 2013-06-30 MED ORDER — SIMETHICONE 80 MG PO CHEW: 80.0000 mg | CHEWABLE_TABLET | ORAL | Status: DC | PRN

## 2013-06-30 MED ORDER — DIPHENHYDRAMINE HCL 25 MG PO CAPS: 25.0000 mg | ORAL_CAPSULE | Freq: Four times a day (QID) | ORAL | Status: DC | PRN

## 2013-06-30 MED ORDER — PRENATAL MULTIVITAMIN CH
1.0000 | ORAL_TABLET | Freq: Every day | ORAL | Status: DC
  Administered 2013-06-30: 1 via ORAL
  Filled 2013-06-30: qty 1

## 2013-06-30 MED ORDER — MEASLES, MUMPS & RUBELLA VAC ~~LOC~~ INJ
0.5000 mL | INJECTION | Freq: Once | SUBCUTANEOUS | Status: DC
  Filled 2013-06-30: qty 0.5

## 2013-06-30 MED ORDER — LANOLIN HYDROUS EX OINT: TOPICAL_OINTMENT | CUTANEOUS | Status: DC | PRN

## 2013-06-30 MED ORDER — OXYCODONE-ACETAMINOPHEN 5-325 MG PO TABS: 1.0000 | ORAL_TABLET | ORAL | Status: DC | PRN

## 2013-06-30 MED ORDER — WITCH HAZEL-GLYCERIN EX PADS: 1.0000 "application " | MEDICATED_PAD | CUTANEOUS | Status: DC | PRN

## 2013-06-30 MED ORDER — ZOLPIDEM TARTRATE 5 MG PO TABS: 5.0000 mg | ORAL_TABLET | Freq: Every evening | ORAL | Status: DC | PRN

## 2013-06-30 MED ORDER — FERROUS SULFATE 325 (65 FE) MG PO TABS
325.0000 mg | ORAL_TABLET | Freq: Two times a day (BID) | ORAL | Status: DC
  Administered 2013-06-30: 325 mg via ORAL
  Filled 2013-06-30: qty 1

## 2013-06-30 MED ORDER — BISACODYL 10 MG RE SUPP
10.0000 mg | Freq: Every day | RECTAL | Status: DC | PRN
Start: 2013-06-30 — End: 2013-06-30

## 2013-06-30 MED ORDER — ONDANSETRON HCL 4 MG/2ML IJ SOLN: 4.0000 mg | INTRAMUSCULAR | Status: DC | PRN

## 2013-06-30 MED ORDER — ONDANSETRON HCL 4 MG PO TABS: 4.0000 mg | ORAL_TABLET | ORAL | Status: DC | PRN

## 2013-06-30 MED ORDER — IBUPROFEN 600 MG PO TABS
600.0000 mg | ORAL_TABLET | Freq: Four times a day (QID) | ORAL | Status: DC
  Administered 2013-06-30 (×2): 600 mg via ORAL
  Filled 2013-06-30 (×2): qty 1

## 2013-06-30 MED ORDER — DIBUCAINE 1 % RE OINT: 1.0000 "application " | TOPICAL_OINTMENT | RECTAL | Status: DC | PRN

## 2013-06-30 MED ORDER — METHYLERGONOVINE MALEATE 0.2 MG PO TABS: 0.2000 mg | ORAL_TABLET | ORAL | Status: DC | PRN

## 2013-06-30 MED ORDER — SENNOSIDES-DOCUSATE SODIUM 8.6-50 MG PO TABS
2.0000 | ORAL_TABLET | ORAL | Status: DC
  Administered 2013-06-30: 2 via ORAL
  Filled 2013-06-30: qty 2

## 2013-06-30 MED ORDER — IBUPROFEN 600 MG PO TABS: 600.0000 mg | ORAL_TABLET | Freq: Four times a day (QID) | ORAL | Status: DC

## 2013-06-30 NOTE — Discharge Instructions (Signed)
Cuidados en el postparto luego de un parto vaginal  (Postpartum Care After Vaginal Delivery) Despus del parto (perodo de postparto), la estada normal en el hospital es de 24-72 horas. Si hubo problemas con el trabajo de parto o el parto, o si tiene otros problemas mdicos, es posible que Patent attorney en el hospital por ms [place].  Mientras est en el hospital, recibir [place] e instrucciones sobre cmo cuidar de usted misma y de su beb recin nacido durante el postparto.  Mientras est en el hospital:   Asegrese de decirle a las enfermeras si siente dolor o Tree surgeon, as como donde Designer, television/film set y Architect.  Si usted tuvo una incisin cerca de la vagina (episiotoma) o si ha tenido Education officer, museum, las enfermeras le pondrn hielo sobre la episiotoma o Psychiatrist. Las bolsas de hielo pueden ayudar a Dietitian y la hinchazn.  Si est amamantando, puede sentir contracciones dolorosas en el tero durante algunas semanas. Esto es normal. Las contracciones ayudan a que el tero vuelva a su tamao normal.  Es normal tener algo de sangrado despus del [place].  Durante los primeros 1-3 das despus del parto, el flujo es de color rojo y la cantidad puede ser similar a un perodo.  Es frecuente que el flujo se inicie y se Production assistant, radio.  En los primeros [place], puede eliminar algunos cogulos pequeos. Informe a las enfermeras si elimina cogulos grandes o aumenta el flujo.  No  elimine los cogulos de sangre por el inodoro antes de que la [employer] vea.  Durante los prximos 3 a [address] despus del parto, el flujo debe ser ms acuoso y rosado o Forensic psychologist.  Chancy Hurter a catorce [employer] del parto, el flujo debe ser una pequea cantidad de secrecin de color blanco amarillento.  La cantidad de flujo disminuir en las primeras semanas despus del parto. El flujo puede detenerse en 6-8 semanas. La mayora de las mujeres no tienen ms flujo a las 12 semanas  despus del [place].  Usted debe cambiar sus apsitos con frecuencia.  Lvese bien las manos con agua y jabn durante al menos 20 segundos despus de cambiar el apsito, usar el bao o antes de Nature conservation officer o Research scientist (life sciences) a su recin nacido.  Usted podr sentir como que tiene que vaciar la vejiga durante las primeras 6-8 horas despus del [place].  En caso de que sienta debilidad, mareo o [place], llame a la enfermera antes de levantarse de la cama por primera vez y antes de tomar una ducha por primera vez.  Dentro de los [employer] del parto, sus mamas pueden comenzar a estar sensibles y [place]. Esto se llama congestin. La sensibilidad en los senos por lo general desaparece dentro de las 48-72 horas despus de que ocurre la congestin. Tambin puede notar que la [place] se escapa de sus senos. Si no est amamantando no estimule sus pechos. La estimulacin de las mamas hace que sus senos produzcan ms [place].  Pasar tanto tiempo como le sea posible con el beb recin nacido es muy importante. Durante ese tiempo, usted y su beb deben sentirse cerca y conocerse uno al otro. Tener al beb en su habitacin (alojamiento conjunto) ayudar a fortalecer el vnculo con el beb recin nacido.Esto le dar tiempo para conocerlo y atenderlo de [place] cmoda.  Las hormonas se modifican despus del parto. A veces, los cambios hormonales pueden causar tristeza o ganas de llorar por un tiempo. Estos sentimientos  no deben durar ms de unos pocos das. Si duran ms que eso, debe hablar con su mdico.  Si lo desea, hable con su mdico acerca de los mtodos de planificacin familiar o mtodos anticonceptivos.  Hable con su mdico acerca de las vacunas. El mdico puede indicarle que se aplique las siguientes vacunas antes de salir del hospital:  Vacuna contra el ttanos, la difteria y la tos ferina (Tdap) o el ttanos y la difteria (Td). Es muy importante que usted y su familia (incluyendo a los abuelos) u otras  personas que cuidan al recin nacido estn al da con las vacunas Tdap o Td. Las vacunas Tdap o Td pueden ayudar a proteger al recin nacido de enfermedades.  Inmunizacin contra la rubola.  Inmunizacin contra la varicela.  Inmunizacin contra la gripe. Usted debe recibir esta vacunacin anual si no la ha recibido durante el embarazo. Document Released: 01/18/2007 Document Revised: 12/16/2011 ExitCare Patient Information 2014 [employer].  

## 2013-06-30 NOTE — Discharge Summary (Signed)
Obstetric Discharge Summary Reason for Admission: induction of labor and for cholestasis of pregnancy Prenatal Procedures: ultrasound Intrapartum Procedures: spontaneous vaginal delivery Postpartum Procedures: none Complications-Operative and Postpartum: none  Hospital course:   Hemoglobin  Date Value Ref Range Status  06/29/2013 12.3  12.0 - 15.0 g/dL Final     HCT  Date Value Ref Range Status  06/29/2013 36.4  36.0 - 46.0 % Final    Physical Exam:  General: alert, cooperative and no distress Lochia: appropriate Uterine Fundus: firm Incision: n/a DVT Evaluation: No evidence of DVT seen on physical exam. Pt was induced for bile salts of 25; She received 2 doses of cytotec, a foley bulb that lasted a few hours, and 2 mu/min of pitocin.  Uncomplicated SVD.  Desires rooming in/early discharge.    Discharge Diagnoses: Term Pregnancy-delivered  Discharge Information: Date: 06/30/2013 Activity: pelvic rest Diet: routine Medications: Ibuprofen Condition: stable Instructions: refer to practice specific booklet Discharge to: rooming in, home tomorrow with baby Follow-up Information   [facility] [place] Schedule an appointment as soon as possible for a visit in 6 weeks.   Contact information:   [address] [place] [id number] [phone]      Newborn Data: Live born female  Birth Weight: 5 lb 10.8 oz (2575 g) APGAR: 9, 9  Home with mother.  CRESENZO-DISHMAN,Jody Aguinaga 06/30/2013, 7:29 AM

## 2013-07-01 ENCOUNTER — Ambulatory Visit: Payer: Self-pay

## 2013-07-01 NOTE — Lactation Note (Signed)
This note was copied from the chart of Brittney Floyd. Lactation Consultation Note Follow up consult:  [name] 37 hours old, 5lb 6.4 oz. Encouraged mother to breastfeed every 3 hours for longer than 10 minutes per feeding, 8-12 times a day, waking baby if she falls asleep during feeding. Reviewed waking techniques. monitoring voids/stools, engorgement care.  Mother denies soreness.   Encouraged mother to call if further assistance is needed.   Patient Name: Brittney Alver FisherDulce Maria Floyd IONGE'XToday's Date: 07/01/2013 Reason for consult: Follow-up assessment   Maternal Data    Feeding Feeding Type: Breast Fed Length of feed: 5 min  LATCH Score/Interventions                      Lactation Tools Discussed/Used     Consult Status Consult Status: PRN    Hardie PulleyBerkelhammer, Ruth Boschen 07/01/2013, 12:36 PM

## 2013-07-06 ENCOUNTER — Inpatient Hospital Stay (HOSPITAL_COMMUNITY): Admission: RE | Admit: 2013-07-06 | Payer: Medicaid Other | Source: Ambulatory Visit

## 2013-07-18 NOTE — Discharge Summary (Signed)
Attestation of Attending Supervision of Advanced Practitioner (CNM/NP): Evaluation and management procedures were performed by the Advanced Practitioner under my supervision and collaboration. I have reviewed the Advanced Practitioner's note and chart, and I agree with the management and plan.  Lesly DukesKelly H Solyana Nonaka 7:17 PM

## 2013-09-13 ENCOUNTER — Encounter (HOSPITAL_COMMUNITY): Payer: Self-pay

## 2013-09-19 ENCOUNTER — Encounter (HOSPITAL_COMMUNITY): Payer: Self-pay

## 2013-09-19 ENCOUNTER — Ambulatory Visit (HOSPITAL_COMMUNITY)
Admission: RE | Admit: 2013-09-19 | Discharge: 2013-09-19 | Disposition: A | Discharge status: Home / Self Care | Payer: Medicaid Other | Source: Ambulatory Visit | Attending: Obstetrics and Gynecology | Admitting: Obstetrics and Gynecology

## 2013-09-19 VITALS — BP 112/64 | Temp 98.4°F | Ht 61.0 in | Wt 152.2 lb

## 2013-09-19 DIAGNOSIS — R87612 Low grade squamous intraepithelial lesion on cytologic smear of cervix (LGSIL): Secondary | ICD-10-CM | POA: Insufficient documentation

## 2013-09-19 DIAGNOSIS — Z1239 Encounter for other screening for malignant neoplasm of breast: Secondary | ICD-10-CM

## 2013-09-19 NOTE — Patient Instructions (Signed)
Taught [facility] how to perform BSE and gave educational materials to take home. Patient did not need a Pap smear today due to last Pap smear was 08/02/2013. [facility] to follow-up for abnormal Pap smear. Appointment scheduled for Wednesday, October 04, 2013 at 1245. Patient aware of appointment and will be there. [facility] verbalized understanding.  [place]annock, Kathaleen Maserhristine Poll, RN 2:51 PM

## 2013-09-19 NOTE — Progress Notes (Signed)
[facility] to follow-up for abnormal Pap smear on 08/02/2013.  Pap Smear:  Pap smear not completed today. [facility] LGS[facility]. [facility] for colposcopy to follow-up for abnormal Pap smear. Appointment scheduled for Wednesday, October 04, 2013 at 1245. Per patient has no history of an abnormal Pap smear 2 years ago that required a colposcopy to follow-up and patient stated she has not had any Pap smears since colposcopy. Last Pap smear result is scanned in EPIC under media.  Physical exam: Breasts Breasts symmetrical. No skin abnormalities bilateral breasts. No nipple retraction bilateral breasts. Patient is currently breastfeeding. No lymphadenopathy. No lumps palpated bilateral breasts. No complaints of pain or tenderness on exam. Screening mammogram recommended at age 33 unless clinically indicated prior.  Pelvic/Bimanual No Pap smear completed today since last Pap smear was 08/02/2013. Pap smear not indicated per [facility] guidelines. .[name]

## 2013-10-04 ENCOUNTER — Ambulatory Visit (INDEPENDENT_AMBULATORY_CARE_PROVIDER_SITE_OTHER): Payer: Self-pay | Admitting: Obstetrics & Gynecology

## 2013-10-04 ENCOUNTER — Encounter: Payer: Self-pay | Admitting: Obstetrics & Gynecology

## 2013-10-04 ENCOUNTER — Other Ambulatory Visit (HOSPITAL_COMMUNITY)
Admission: RE | Admit: 2013-10-04 | Discharge: 2013-10-04 | Disposition: A | Discharge status: Home / Self Care | Payer: Medicaid Other | Source: Ambulatory Visit | Attending: Obstetrics & Gynecology | Admitting: Obstetrics & Gynecology

## 2013-10-04 VITALS — BP 116/76 | HR 69 | Temp 97.0°F | Ht 61.0 in | Wt 149.9 lb

## 2013-10-04 DIAGNOSIS — Z01812 Encounter for preprocedural laboratory examination: Secondary | ICD-10-CM

## 2013-10-04 DIAGNOSIS — R87612 Low grade squamous intraepithelial lesion on cytologic smear of cervix (LGSIL): Secondary | ICD-10-CM

## 2013-10-04 LAB — POCT PREGNANCY, URINE: Preg Test, Ur: NEGATIVE

## 2013-10-04 NOTE — Patient Instructions (Signed)

## 2013-10-04 NOTE — Progress Notes (Signed)
Patient ID: Brittney Floyd, female   DOB: Nov 11, 1980, 33 y.o.   MRN: [id number]  Chief Complaint  Patient presents with  . Colposcopy    HPI Brittney Floyd is a 33 y.o. female.  LSIL pap 08/02/13 for colpscopy  HPI  Indications: Pap smear on April 2015 showed: low-grade squamous intraepithelial neoplasia (LGSIL - encompassing HPV,mild dysplasia,CIN I). Previous colposcopy: no. Prior cervical treatment: no treatment.  Past Medical History  Diagnosis Date  . Chlamydia   . Medical history non-contributory     Past Surgical History  Procedure Laterality Date  . No past surgeries      Family History  Problem Relation Age of Onset  . Heart disease Father   . Hepatitis Father   . Asthma Paternal Grandmother     Social History History  Substance Use Topics  . Smoking status: Never Smoker   . Smokeless tobacco: Not on file  . Alcohol Use: No    Allergies  Allergen Reactions  . Penicillins Rash    Current Outpatient Prescriptions  Medication Sig Dispense Refill  . Prenatal Vit-Fe Fumarate-FA (PRENATAL MULTIVITAMIN) TABS tablet Take 1 tablet by mouth daily at 12 noon.       No current facility-administered medications for this visit.    Review of Systems Review of Systems  Blood pressure 116/76, pulse 69, temperature 97 F (36.1 C), temperature source Oral, height 5\' 1"  (1.549 m), weight 149 lb 14.4 oz (67.994 kg), last menstrual period 09/13/2013, not currently breastfeeding.  Physical Exam Physical Exam  Data Reviewed Pap result  Assessment    Procedure Details  The risks and benefits of the procedure and Written informed consent obtained.  Speculum placed in vagina and excellent visualization of cervix achieved, cervix swabbed x 3 with acetic acid solution. Biopsy at 0900 and 1100 Specimens: BX and ECC  Complications: none.     Plan    Specimens labelled and sent to Pathology. Return to discuss Pathology results in 2 weeks.       ARNOLD,JAMES 10/04/2013, 1:53 PM

## 2013-10-18 ENCOUNTER — Encounter: Payer: Self-pay | Admitting: Obstetrics & Gynecology

## 2013-10-18 ENCOUNTER — Ambulatory Visit (INDEPENDENT_AMBULATORY_CARE_PROVIDER_SITE_OTHER): Payer: Self-pay | Admitting: Obstetrics & Gynecology

## 2013-10-18 VITALS — BP 110/69 | HR 65 | Temp 98.0°F | Resp 20 | Ht 61.0 in | Wt 151.1 lb

## 2013-10-18 DIAGNOSIS — R87612 Low grade squamous intraepithelial lesion on cytologic smear of cervix (LGSIL): Secondary | ICD-10-CM

## 2013-10-18 NOTE — Progress Notes (Signed)
Patient ID: Brittney Floyd, female   DOB: 07-02-1980, 33 y.o.   MRN: [id number] Patient's last menstrual period was 10/07/2013. Colposcopy done on 10/05/13 for LSIL pap, neg ECC and TZ had koilocytic atypia only. Patient informed and will return in 12 mo for cotesting. Questions were answered.    Adam PhenixJames G Norman Piacentini, MD 10/18/2013

## 2013-10-18 NOTE — Patient Instructions (Signed)
Informe de Papanicolau anormal  (Abnormal Pap Test Information) En el Papanicolaou se estudian las clulas de la superficie del cuello del tero para observar si son normales, anormales o si muestran signos de alteraciones debido a un tipo de virus llamado virus del papiloma humano o VPH. Se llama displasia a las clulas cervicales afectadas por el VPH. La displasia no es cncer, pero indica que hay clulas anormales que se encuentran en la superficie del cuello uterino. Segn el grado de displasia, algunas de las clulas puede ser consideradas pre-cancerosas y pueden convertirse en cncer con el tiempo, si se retrasa la atencin mdica.  QU SIGNIFICA QUE UN PAPANICOAU ES ANORMAL?  Tener una prueba de Papanicolaou anormal no significa que tiene cncer. Sin embargo, ciertos tipos de Papanicolaou anormales pueden ser signo de un riesgo ms alto de desarrollar cncer. El mdico le ordenar otras pruebas para investigar ms sobre las clulas anormales. Un resultado anormal de la prueba de Papanicolaou puede mostrar:   Cambios pequeos e inespecficos que deben controlarse cuidadosamente.   Displasia cervical que ha provocado cambios leves y que puede controlarse a travs del tiempo.   Displasia cervical ms grave que debe seguirse y tratarse para estar seguros de que el problema desaparece.  Cncer.  Cuando se detecta una displasia cervical grave y se trata a tiempo, rara vez llega a ser cncer.  QU DEBO HACER LUEGO DE UN PAPANICOLAU ANORMAL?   Puede ser necesario realizar una colposcopia. Este es un procedimiento en el que se examina el cuello del tero con iluminacin y magnificacin.  Podrn extraerle una pequea muestra de tejido del cuello uterino (biopsia) para ser analizada. Esto generalmente se realiza si hay reas que parecen infectadas.  La muestra de clulas del canal cervical se toma con un cepillo pequeo o un instrumento de raspado (cureta). Segn los resultados de los  procedimientos anteriores, el mdico podr indicar un tratamiento con crioterapia en el cuello uterino o un LEEP quirrgico, en el que se extirpa una porcin del cuello uterino. LEEP son las siglas en ingls de "procedimiento de escisin electroquirrgica con asa". En raras ocasiones, el mdico podr indicar una biopsia en cono. Este es un procedimiento en el que se extrae una pequea muestra en forma de cono del cuello del tero. Se extrae en el rea en la que se encuentran las clulas.   QU DEBO HACER SI TENGO DISPLASIA O CNCER?  La derivarn a un especialista. La radioterapia es un tratamiento para el cncer en los casos ms avanzados. La histerectoma es la ltima opcin de tratamiento para la displasia, pero es un tratamiento ms frecuente para el cncer. El mdico comentar con usted todas las opciones de tratamiento.  QU DEBE HACER DESPUS DEL TRATAMIENTO?  Si su Papanicolaou no es normal, debe continuar con las pruebas y controles habituales segn las indicaciones de su mdico. Su problema cervical se vigilar cuidadosamente para que no empeore. Adems, el mdico controlar y tratar los nuevos problemas que puedan surgir.  Document Released: 06/15/2011 Document Revised: 07/18/2012 ExitCare Patient Information 2015 ExitCare[employer]. This information is not intended to replace advice given to you by your health care provider. Make sure you discuss any questions you have with your health care provider.  

## 2013-10-20 ENCOUNTER — Encounter: Payer: Self-pay | Admitting: *Deleted

## 2013-11-27 ENCOUNTER — Ambulatory Visit: Payer: Self-pay

## 2013-11-28 ENCOUNTER — Encounter: Payer: Self-pay | Admitting: General Practice

## 2014-02-05 ENCOUNTER — Encounter: Payer: Self-pay | Admitting: Obstetrics & Gynecology

## 2015-10-11 ENCOUNTER — Inpatient Hospital Stay (HOSPITAL_COMMUNITY)
Admission: AD | Admit: 2015-10-11 | Discharge: 2015-10-11 | Disposition: A | Discharge status: Home / Self Care | Payer: Self-pay | Source: Ambulatory Visit | Attending: Obstetrics & Gynecology | Admitting: Obstetrics & Gynecology

## 2015-10-11 ENCOUNTER — Inpatient Hospital Stay (HOSPITAL_COMMUNITY): Payer: Self-pay

## 2015-10-11 ENCOUNTER — Encounter (HOSPITAL_COMMUNITY): Payer: Self-pay | Admitting: *Deleted

## 2015-10-11 DIAGNOSIS — Z3A09 9 weeks gestation of pregnancy: Secondary | ICD-10-CM | POA: Insufficient documentation

## 2015-10-11 DIAGNOSIS — Z880 Allergy status to penicillin: Secondary | ICD-10-CM | POA: Insufficient documentation

## 2015-10-11 DIAGNOSIS — O26891 Other specified pregnancy related conditions, first trimester: Secondary | ICD-10-CM | POA: Insufficient documentation

## 2015-10-11 DIAGNOSIS — O209 Hemorrhage in early pregnancy, unspecified: Secondary | ICD-10-CM

## 2015-10-11 DIAGNOSIS — M549 Dorsalgia, unspecified: Secondary | ICD-10-CM | POA: Insufficient documentation

## 2015-10-11 DIAGNOSIS — R109 Unspecified abdominal pain: Secondary | ICD-10-CM | POA: Insufficient documentation

## 2015-10-11 DIAGNOSIS — O200 Threatened abortion: Secondary | ICD-10-CM | POA: Insufficient documentation

## 2015-10-11 LAB — POCT PREGNANCY, URINE: PREG TEST UR: POSITIVE — AB

## 2015-10-11 LAB — URINE MICROSCOPIC-ADD ON: WBC, UA: NONE SEEN WBC/hpf (ref 0–5)

## 2015-10-11 LAB — CBC
HCT: 41.7 % (ref 36.0–46.0)
Hemoglobin: 14.6 g/dL (ref 12.0–15.0)
MCH: 29.1 pg (ref 26.0–34.0)
MCHC: 35 g/dL (ref 30.0–36.0)
MCV: 83.2 fL (ref 78.0–100.0)
Platelets: 229 10*3/uL (ref 150–400)
RBC: 5.01 MIL/uL (ref 3.87–5.11)
RDW: 13.7 % (ref 11.5–15.5)
WBC: 11.5 10*3/uL — ABNORMAL HIGH (ref 4.0–10.5)

## 2015-10-11 LAB — WET PREP, GENITAL
Clue Cells Wet Prep HPF POC: NONE SEEN
SPERM: NONE SEEN
TRICH WET PREP: NONE SEEN
YEAST WET PREP: NONE SEEN

## 2015-10-11 LAB — URINALYSIS, ROUTINE W REFLEX MICROSCOPIC
BILIRUBIN URINE: NEGATIVE
Glucose, UA: NEGATIVE mg/dL
KETONES UR: NEGATIVE mg/dL
Leukocytes, UA: NEGATIVE
Nitrite: NEGATIVE
PROTEIN: NEGATIVE mg/dL
Specific Gravity, Urine: 1.02 (ref 1.005–1.030)
pH: 5.5 (ref 5.0–8.0)

## 2015-10-11 LAB — HCG, QUANTITATIVE, PREGNANCY: hCG, Beta Chain, Quant, S: 893 m[IU]/mL — ABNORMAL HIGH (ref <5)

## 2015-10-11 LAB — ABO/RH: ABO/RH(D): O POS

## 2015-10-11 NOTE — MAU Provider Note (Signed)
Chief Complaint: Abdominal Pain; Back Pain; Vaginal Bleeding; and Possible Pregnancy   First Provider Initiated Contact with Patient 10/11/15 1816        SUBJECTIVE  HPI: Brittney Floyd is a 35 y.o. G3P2002 at 4463w2d by LMP who presents to maternity admissions reporting pain and bleeding.   Has regular monthly periods, some variation by a few days.  Sure LMP.  Started bleeding today.[name].  Has some lower abdominal and back pain.  She denies vaginal itching/burning, urinary symptoms, h/a, dizziness, n/v, or fever/chills.     Abdominal Pain This is a new problem. The current episode started today. The onset quality is gradual. The problem occurs intermittently. The problem has been unchanged. The pain is located in the suprapubic region. The pain is mild. The quality of the pain is cramping. The abdominal pain radiates to the back. Pertinent negatives include no constipation, diarrhea, dysuria, fever, headaches, nausea or vomiting. Nothing aggravates the pain. The pain is relieved by nothing. She has tried nothing for the symptoms.  Vaginal Bleeding The patient's primary symptoms include vaginal bleeding. The patient's pertinent negatives include no genital lesions or genital odor. This is a new problem. The current episode started today. The problem occurs intermittently. The problem has been unchanged. The pain is mild. The problem affects both sides. She is pregnant. Associated symptoms include abdominal pain. Pertinent negatives include no constipation, diarrhea, dysuria, fever, headaches, nausea or vomiting. The vaginal discharge was bloody. The vaginal bleeding is lighter than menses. She has not been passing clots. She has not been passing tissue. Nothing aggravates the symptoms. She has tried nothing for the symptoms.  RN Note: Just feeling pain in lower abd. Started around around noon. Noted blood when went to pee.now also having back pain. Has not been seen anywhere yet with  preg   Past Medical History  Diagnosis Date  . Chlamydia   . Medical history non-contributory    Past Surgical History  Procedure Laterality Date  . No past surgeries    . Colposcopy     Social History   Social History  . Marital Status: Married    Spouse Name: N/A  . Number of Children: N/A  . Years of Education: N/A   Occupational History  . Not on file.   Social History Main Topics  . Smoking status: Never Smoker   . Smokeless tobacco: Not on file  . Alcohol Use: No  . Drug Use: No  . Sexual Activity: Yes   Other Topics Concern  . Not on file   Social History Narrative   No current facility-administered medications on file prior to encounter.   Current Outpatient Prescriptions on File Prior to Encounter  Medication Sig Dispense Refill  . Prenatal Vit-Fe Fumarate-FA (PRENATAL MULTIVITAMIN) TABS tablet Take 1 tablet by mouth daily at 12 noon.     Allergies  Allergen Reactions  . Penicillins Rash    Has patient had a PCN reaction causing immediate rash, facial/tongue/throat swelling, SOB or lightheadedness with hypotension: Yes Has patient had a PCN reaction causing severe rash involving mucus membranes or skin necrosis: No Has patient had a PCN reaction that required hospitalization No Has patient had a PCN reaction occurring within the last 10 years: Yes If all of the above answers are "NO", then may proceed with Cephalosporin use.     I have reviewed patient's Past Medical Hx, Surgical Hx, Family Hx, Social Hx, medications and allergies.   ROS:  Review of Systems  Constitutional: Negative  for fever.  Gastrointestinal: Positive for abdominal pain. Negative for nausea, vomiting, diarrhea and constipation.  Genitourinary: Positive for vaginal bleeding. Negative for dysuria.  Neurological: Negative for headaches.      Physical Exam  Patient Vitals for the past 24 hrs:  BP Temp Temp src Pulse Resp Height Weight  10/11/15 1628 109/72 mmHg 98.1 F  (36.7 C) Oral 60 18 5' (1.524 m) 128 lb 6.4 oz (58.242 kg)    Physical Exam  Constitutional: Well-developed, well-nourished female in no acute distress.  Cardiovascular: normal rate Respiratory: normal effort GI: Abd soft, non-tender. Pos BS x 4 MS: Extremities nontender, no edema, normal ROM Neurologic: Alert and oriented x 4.  GU: Neg CVAT.  PELVIC EXAM: Cervix pink, visually closed, without lesion, scant light red discharge, vaginal walls and external genitalia normal Bimanual exam: Cervix 0/long/high, firm, anterior, neg CMT, uterus nontender, nonenlarged, adnexa without tenderness, enlargement, or mass   LAB RESULTS Results for orders placed or performed during the hospital encounter of 10/11/15 (from the past 24 hour(s))  Urinalysis, Routine w reflex microscopic (not at [facility])     Status: Abnormal   Collection Time: 10/11/15  4:30 PM  Result Value Ref Range   Color, Urine YELLOW YELLOW   APPearance CLEAR CLEAR   Specific Gravity, Urine 1.020 1.005 - 1.030   pH 5.5 5.0 - 8.0   Glucose, UA NEGATIVE NEGATIVE mg/dL   Hgb urine dipstick LARGE (A) NEGATIVE   Bilirubin Urine NEGATIVE NEGATIVE   Ketones, ur NEGATIVE NEGATIVE mg/dL   Protein, ur NEGATIVE NEGATIVE mg/dL   Nitrite NEGATIVE NEGATIVE   Leukocytes, UA NEGATIVE NEGATIVE  Urine microscopic-add on     Status: Abnormal   Collection Time: 10/11/15  4:30 PM  Result Value Ref Range   Squamous Epithelial / LPF 0-5 (A) NONE SEEN   WBC, UA NONE SEEN 0 - 5 WBC/hpf   RBC / HPF 0-5 0 - 5 RBC/hpf   Bacteria, UA FEW (A) NONE SEEN  Pregnancy, urine POC     Status: Abnormal   Collection Time: 10/11/15  4:34 PM  Result Value Ref Range   Preg Test, Ur POSITIVE (A) NEGATIVE  Results for PEGGIE, HORNAK (MRN [id number]) as of 10/14/2015 17:38  Ref. Range 10/11/2015 18:24  WBC Latest Ref Range: 4.0-10.5 K/uL 11.5 (H)  RBC Latest Ref Range: 3.87-5.11 MIL/uL 5.01  Hemoglobin Latest Ref Range: 12.0-15.0 g/dL 40.9  HCT Latest Ref  Range: 36.0-46.0 % 41.7  MCV Latest Ref Range: 78.0-100.0 fL 83.2  MCH Latest Ref Range: 26.0-34.0 pg 29.1  MCHC Latest Ref Range: 30.0-36.0 g/dL 81.1  RDW Latest Ref Range: 11.5-15.5 % 13.7  Platelets Latest Ref Range: 150-400 K/uL 229   Results for ESTIE, SPROULE (MRN [id number]) as of 10/14/2015 17:38  Ref. Range 10/11/2015 18:26  HCG, Beta Chain, Quant, S Latest Ref Range: <5 mIU/mL 893 (H)      IMAGING US Ob Comp Less 14 Wks  10/11/2015  CLINICAL DATA:  Vaginal bleeding.  Positive beta HCG EXAM: OBSTETRIC <14 WK [place] AND TRANSVAGINAL OB US TECHNIQUE: Both transabdominal and transvaginal ultrasound examinations were performed for complete evaluation of the gestation as well as the maternal uterus, adnexal regions, and pelvic cul-de-sac. Transvaginal technique was performed to assess early pregnancy. COMPARISON:  None. FINDINGS: Intrauterine gestational sac: Not visualized Yolk sac:  Not visualized Embryo:  Not visualized Cardiac Activity: Not visualized Subchorionic hemorrhage:  None visualized. Maternal uterus/adnexae: No intrauterine mass is seen. Cervical os is closed. Right ovary  measures with 2.2 x 1.6 x 1.1 cm in size. Left ovary measures 2.9 x 2.1 x 1.8 cm. Corpus luteum is noted in the right ovary measuring 1.5 x 1.2 x 1.3 cm. There is a small amount of free pelvic fluid. IMPRESSION: No intrauterine mass or intrauterine gestation seen. Small amount of free pelvic fluid. No extrauterine pelvic mass beyond apparent small corpus luteum right ovary. Differential considerations given positive pregnancy test include intrauterine gestation too early to be seen by either transabdominal or transvaginal technique; recent spontaneous abortion ; ectopic gestation. Given this circumstance, close clinical and laboratory surveillance advised. Timing of repeat ultrasound will in large part will depend on beta HCG values going forward. Electronically Signed   By: Bretta BangWilliam  Woodruff III M.D.   On:  10/11/2015 19:42   [place] Ob Transvaginal  10/11/2015  CLINICAL DATA:  Vaginal bleeding.  Positive beta HCG EXAM: OBSTETRIC <14 WK US AND TRANSVAGINAL OB US TECHNIQUE: Both transabdominal and transvaginal ultrasound examinations were performed for complete evaluation of the gestation as well as the maternal uterus, adnexal regions, and pelvic cul-de-sac. Transvaginal technique was performed to assess early pregnancy. COMPARISON:  None. FINDINGS: Intrauterine gestational sac: Not visualized Yolk sac:  Not visualized Embryo:  Not visualized Cardiac Activity: Not visualized Subchorionic hemorrhage:  None visualized. Maternal uterus/adnexae: No intrauterine mass is seen. Cervical os is closed. Right ovary measures with 2.2 x 1.6 x 1.1 cm in size. Left ovary measures 2.9 x 2.1 x 1.8 cm. Corpus luteum is noted in the right ovary measuring 1.5 x 1.2 x 1.3 cm. There is a small amount of free pelvic fluid. IMPRESSION: No intrauterine mass or intrauterine gestation seen. Small amount of free pelvic fluid. No extrauterine pelvic mass beyond apparent small corpus luteum right ovary. Differential considerations given positive pregnancy test include intrauterine gestation too early to be seen by either transabdominal or transvaginal technique; recent spontaneous abortion ; ectopic gestation. Given this circumstance, close clinical and laboratory surveillance advised. Timing of repeat ultrasound will in large part will depend on beta HCG values going forward. Electronically Signed   By: Bretta BangWilliam  Woodruff III M.D.   On: 10/11/2015 19:42    MAU Management/MDM: Ordered usual first trimester r/o ectopic labs.   Pelvic exam and cultures done Will check baseline Ultrasound to rule out ectopic.  Cultures were done to rule out pelvic infection Blood drawn for Quant HCG, CBC, ABO/Rh    This bleeding/pain can represent a normal pregnancy with bleeding, spontaneous abortion or even an ectopic which can be life-threatening.  The  process as listed above helps to determine which of these is present.    ASSESSMENT 1. Bleeding in early pregnancy   2.  Threatened abortion 3.  Pregnancy at 1928w2d by LMP  PLAN Discharge home Plan to repeat HCG level in 48 hours in MAU Will repeat  Ultrasound in about 7-10 days if HCG levels double appropriately     Medication List    ASK your doctor about these medications        prenatal multivitamin Tabs tablet  Take 1 tablet by mouth daily at 12 noon.        Pt stable at time of discharge. Encouraged to return here or to other Urgent Care/ED if she develops worsening of symptoms, increase in pain, fever, or other concerning symptoms.    Wynelle BourgeoisMarie Williams CNM, MSN Certified Nurse-Midwife 10/11/2015  6:25 PM

## 2015-10-11 NOTE — MAU Note (Signed)
Just feeling pain in lower abd.  Started around around noon. Noted blood when went to pee.now also having back pain.  Has not been seen anywhere yet with preg

## 2015-10-11 NOTE — Discharge Instructions (Signed)
Hemorragia vaginal durante el embarazo (primer trimestre)  (Vaginal Bleeding During Pregnancy, First Trimester)  Durante los primeros meses del embarazo es relativamente frecuente que se presente una pequeña hemorragia (manchas). Esta situación generalmente mejora por sí misma. Estas hemorragias o manchas tienen diversas causas al inicio del embarazo. Algunas manchas pueden estar relacionadas al embarazo y otras no. En la mayoría de los casos, la hemorragia es normal y no es un problema. Sin embargo, la hemorragia también puede ser un signo de algo grave. Debe informar a su médico de inmediato si tiene alguna hemorragia vaginal.  Algunas causas posibles de hemorragia vaginal durante el primer trimestre incluyen:  · Infección o inflamación del cuello del útero.  · Crecimientos anormales (pólipos) en el cuello del útero.  · Aborto espontáneo o amenaza de aborto espontáneo.  · Tejido del embarazo se ha desarrollado fuera del útero y en una trompa de falopio (embarazo ectópico).  · Se han desarrollado pequeños quistes en el útero en lugar de tejido de embarazo (embarazo molar).  INSTRUCCIONES PARA EL CUIDADO EN EL HOGAR   Controle su afección para ver si hay cambios. Las siguientes indicaciones ayudarán a aliviar cualquier molestia que pueda sentir:  · Siga las indicaciones del médico para restringir su actividad. Si el médico le indica descanso en la cama, debe quedarse en la cama y levantarse solo para ir al baño. No obstante, el médico puede permitirle que continué con tareas livianas.  · Si es necesario, organícese para que alguien le ayude con las actividades y responsabilidades cotidianas mientras está en cama.  · Lleve un registro de la cantidad y la saturación de las toallas higiénicas que utiliza cada día. Anote este dato.  · No use tampones.No se haga duchas vaginales.  · No tenga relaciones sexuales u orgasmos hasta que el médico la autorice.  · Si elimina tejido por la vagina, guárdelo para mostrárselo al  médico.  · Tome solo medicamentos de venta libre o recetados, según las indicaciones del médico.  · No tome aspirina, ya que puede causar hemorragias.  · Cumpla con todas las visitas de control, según le indique su médico.  SOLICITE ATENCIÓN MÉDICA SI:  · Tiene un sangrado vaginal en cualquier momento del embarazo.  · Tiene calambres o dolores de parto.  · Tiene fiebre que los medicamentos no pueden controlar.  SOLICITE ATENCIÓN MÉDICA DE INMEDIATO SI:   · Siente calambres intensos en la espalda o en el vientre (abdomen).  · Elimina coágulos grandes o tejido por la vagina.  · La hemorragia aumenta.  · Si se siente mareada, débil o se desmaya.  · Tiene escalofríos.  · Tiene una pérdida importante o sale líquido a borbotones por la vagina.  · Se desmaya mientras defeca.  ASEGÚRESE DE QUE:  · Comprende estas instrucciones.  · Controlará su afección.  · Recibirá ayuda de inmediato si no mejora o si empeora.     Esta información no tiene como fin reemplazar el consejo del médico. Asegúrese de hacerle al médico cualquier pregunta que tenga.     Document Released: 12/31/2004 Document Revised: 03/28/2013  Elsevier Interactive Patient Education ©2016 Elsevier Inc[employer]

## 2015-10-12 LAB — HIV ANTIBODY (ROUTINE TESTING W REFLEX): HIV Screen 4th Generation wRfx: NONREACTIVE

## 2015-10-13 ENCOUNTER — Encounter (HOSPITAL_COMMUNITY): Payer: Self-pay | Admitting: Advanced Practice Midwife

## 2015-10-13 ENCOUNTER — Inpatient Hospital Stay (HOSPITAL_COMMUNITY)
Admission: AD | Admit: 2015-10-13 | Discharge: 2015-10-13 | Disposition: A | Discharge status: Home / Self Care | Payer: Self-pay | Source: Ambulatory Visit | Attending: Obstetrics & Gynecology | Admitting: Obstetrics & Gynecology

## 2015-10-13 DIAGNOSIS — O021 Missed abortion: Secondary | ICD-10-CM | POA: Insufficient documentation

## 2015-10-13 DIAGNOSIS — Z3A09 9 weeks gestation of pregnancy: Secondary | ICD-10-CM | POA: Insufficient documentation

## 2015-10-13 LAB — HCG, QUANTITATIVE, PREGNANCY: HCG, BETA CHAIN, QUANT, S: 544 m[IU]/mL — AB (ref <5)

## 2015-10-13 NOTE — MAU Note (Signed)
No pain and minimal amount of vaginal bleeding, no red, had some clear discharge today.  Here for follow up BHCG

## 2015-10-13 NOTE — Discharge Instructions (Signed)
Aborto incompleto °(Incomplete Miscarriage) °Un aborto espontáneo es la pérdida repentina de un bebé en gestación (feto) antes de la semana 20 del embarazo. En un aborto espontáneo, partes del feto o la placenta (alumbramiento) permanecen en el cuerpo.  °El aborto espontáneo puede ser una experiencia que afecte emocionalmente a la persona. Hable con su médico si tiene preguntas sobre el aborto espontáneo, el proceso de duelo y los planes futuros de embarazo. °CAUSAS  °· Algunos problemas cromosómicos pueden hacer imposible que el bebé se desarrolle normalmente. Los problemas con los genes o cromosomas del bebé son, en la mayoría de los casos, el resultado de errores que se producen, al azar, cuando el embrión se divide y crece. Estos problemas no se heredan de los padres. °· Infección en el cuello del útero. °· Problemas hormonales. °· Problemas en el cuello del útero, como tener un útero incompetente. Esto ocurre cuando los tejidos no son lo suficientemente fuertes como para contener el embarazo. °· Problemas del útero, como un útero con forma anormal, los fibromas o anormalidades congénitas. °· Ciertas enfermedades crónicas. °· No fume, no beba alcohol, ni consuma drogas. °· Traumatismos. °SÍNTOMAS  °· Sangrado o manchado vaginal, con o sin cólicos o dolor. °· Dolor o cólicos en el abdomen o en la cintura. °· Eliminación de líquido, tejidos o coágulos grandes por la vagina. °DIAGNÓSTICO  °El médico le hará un examen físico. También le indicará una ecografía para confirmar el aborto. Es posible que se realicen análisis de sangre. °TRATAMIENTO  °· Generalmente se realiza un procedimiento de dilatación y curetaje (D y C). Durante el procedimiento de dilatación y curetaje, el cuello del útero se abre (dilata) y se retira todo resto de tejido fetal o placentario del útero. °· Si hay una infección, le recetarán antibióticos. Posiblemente le receten otros medicamentos para reducir (contraer) el tamaño del útero si hay  mucha hemorragia. °· Si su tipo de sangre es Rh negativo y el del bebé es Rh positivo, necesitará una inyección de inmunoglobulina Rho(D). Esta inyección protegerá a los futuros bebés de tener problemas de compatibilidad Rh en futuros embarazos. °· Probablemente le indiquen reposo. Esto significa que debe quedarse en cama y levantarse únicamente para ir al baño. °INSTRUCCIONES PARA EL CUIDADO EN EL HOGAR  °· Haga reposo según las indicaciones del médico. °· Limite las actividades según las indicaciones del médico. Es posible que se le permita retomar las actividades livianas si no se le realizó un curetaje, pero necesitará tratamiento adicional. °· Lleve un registro de la cantidad de toallas sanitarias que usa por día. Observe cuán impregnadas (saturadas) están. Registre esta información. °· No  use tampones. °· No se haga duchas vaginales ni tenga relaciones sexuales hasta que el médico la autorice. °· Asista a todas las citas de seguimiento para una nueva evaluación y para continuar el tratamiento. °· Sólo tome medicamentos de venta libre o recetados para calmar el dolor, el malestar o bajar la fiebre, según las indicaciones de su médico. °· Tome los antibióticos como le indicó el médico. Asegúrese de que finaliza la prescripción completa aunque se sienta mejor. °SOLICITE ATENCIÓN MÉDICA DE INMEDIATO SI:  °· Siente calambres intensos en el estómago, en la espalda o en el abdomen. °· Le sube la fiebre sin motivo (asegúrese de registrar las cifras). °· Elimina coágulos grandes o tejidos (consérvelos para que el médico los analice). °· La hemorragia aumenta. °· Se siente mareada, débil o tiene episodios de desmayo. °ASEGÚRESE DE QUE:  °· Comprende estas instrucciones. °·   Controlar su afeccin.  Recibir ayuda de inmediato si no mejora o si empeora.   Esta informacin no tiene Theme park manager el consejo del mdico. Asegrese de hacerle al mdico cualquier pregunta que tenga.   Document Released: 03/23/2005  Document Revised: 01/11/2013 Elsevier Interactive Patient Education [employer] Incomplete Miscarriage A miscarriage is the sudden loss of an unborn baby (fetus) before the 20th week of pregnancy. In an incomplete miscarriage, parts of the fetus or placenta (afterbirth) remain in the body.  Having a miscarriage can be an emotional experience. Talk with your health care provider about any questions you may have about miscarrying, the grieving process, and your future pregnancy plans. CAUSES   Problems with the fetal chromosomes that make it impossible for the baby to develop normally. Problems with the baby's genes or chromosomes are most often the result of errors that occur by chance as the embryo divides and grows. The problems are not inherited from the parents.  Infection of the cervix or uterus.  Hormone problems.  Problems with the cervix, such as having an incompetent cervix. This is when the tissue in the cervix is not strong enough to hold the pregnancy.  Problems with the uterus, such as an abnormally shaped uterus, uterine fibroids, or congenital abnormalities.  Certain medical conditions.  Smoking, drinking alcohol, or taking illegal drugs.  Trauma. SYMPTOMS   Vaginal bleeding or spotting, with or without cramps or pain.  Pain or cramping in the abdomen or lower back.  Passing fluid, tissue, or blood clots from the vagina. DIAGNOSIS  Your health care provider will perform a physical exam. You may also have an ultrasound to confirm the miscarriage. Blood or urine tests may also be ordered. TREATMENT   Usually, a dilation and curettage (D&C) procedure is performed. During a D&C procedure, the cervix is widened (dilated) and any remaining fetal or placental tissue is gently removed from the uterus.  Antibiotic medicines are prescribed if there is an infection. Other medicines may be given to reduce the size of the uterus (contract) if there is a lot of  bleeding.  If you have Rh negative blood and your baby was Rh positive, you will need a Rho (D) immune globulin shot. This shot will protect any future baby from having Rh blood problems in future pregnancies.  You may be confined to bed rest. This means you should stay in bed and only get up to use the bathroom. HOME CARE INSTRUCTIONS   Rest as directed by your health care provider.  Restrict activity as directed by your health care provider. You may be allowed to continue light activity if curettage was not done but you require further treatment.  Keep track of the number of pads you use each day. Keep track of how soaked (saturated) they are. Record this information.  Do not  use tampons.  Do not douche or have sexual intercourse until approved by your health care provider.  Keep all follow-up appointments for reevaluation and continuing management.  Only take over-the-counter or prescription medicines for pain, fever, or discomfort as directed by your health care provider.  Take antibiotic medicine as directed by your health care provider. Make sure you finish it even if you start to feel better. SEEK IMMEDIATE MEDICAL CARE IF:   You experience severe cramps in your stomach, back, or abdomen.  You have an unexplained temperature (make sure to record these temperatures).  You pass large clots or tissue (save these for your health care  provider to inspect).  Your bleeding increases.  You become light-headed, weak, or have fainting episodes. MAKE SURE YOU:   Understand these instructions.  Will watch your condition.  Will get help right away if you are not doing well or get worse.   This information is not intended to replace advice given to you by your health care provider. Make sure you discuss any questions you have with your health care provider.   Document Released: 03/23/2005 Document Revised: 04/13/2014 Document Reviewed: 10/20/2012 Elsevier Interactive Patient  Education [employer]2016 Else[employer] Inc[employer]

## 2015-10-13 NOTE — MAU Provider Note (Signed)
History   Chief Complaint:  Follow-up   Brittney Floyd is  35 y.o. [id number] Patient's last menstrual period was 08/07/2015.Brittney Floyd. Patient is here for follow up of quantitative HCG and ongoing surveillance of pregnancy status.   She is 3846w4d weeks gestation  by LMP.    RN Note: No pain and minimal amount of vaginal bleeding, no red, had some clear discharge today. Here for follow up BHCG           Since her last visit, the patient is without new complaint.   The patient reports bleeding as  none now.    General ROS:  negative  Her previous Quantitative HCG values are:     Physical Exam   Blood pressure 102/61, pulse 58, temperature 98 F (36.7 C), temperature source Oral, resp. rate 16, last menstrual period 08/07/2015.  Focused Gynecological Exam: examination not indicated  Labs: Results for orders placed or performed during the hospital encounter of 10/13/15 (from the past 24 hour(s))  hCG, quantitative, pregnancy   Collection Time: 10/13/15  4:47 PM  Result Value Ref Range   hCG, Beta Chain, Quant, S 544 (H) <5 mIU/mL    Ref. Range 10/11/2015 18:26  HCG, Beta Chain, Quant, S Latest Ref Range: <5 mIU/mL 893 (H)    Ultrasound Studies:   [place] Ob Comp Less 14 Wks  10/11/2015  CLINICAL DATA:  Vaginal bleeding.  Positive beta HCG EXAM: OBSTETRIC <14 WK US AND TRANSVAGINAL OB US TECHNIQUE: Both transabdominal and transvaginal ultrasound examinations were performed for complete evaluation of the gestation as well as the maternal uterus, adnexal regions, and pelvic cul-de-sac. Transvaginal technique was performed to assess early pregnancy. COMPARISON:  None. FINDINGS: Intrauterine gestational sac: Not visualized Yolk sac:  Not visualized Embryo:  Not visualized Cardiac Activity: Not visualized Subchorionic hemorrhage:  None visualized. Maternal uterus/adnexae: No intrauterine mass is seen. Cervical os is closed. Right ovary measures with 2.2 x 1.6 x 1.1 cm in size. Left ovary  measures 2.9 x 2.1 x 1.8 cm. Corpus luteum is noted in the right ovary measuring 1.5 x 1.2 x 1.3 cm. There is a small amount of free pelvic fluid. IMPRESSION: No intrauterine mass or intrauterine gestation seen. Small amount of free pelvic fluid. No extrauterine pelvic mass beyond apparent small corpus luteum right ovary. Differential considerations given positive pregnancy test include intrauterine gestation too early to be seen by either transabdominal or transvaginal technique; recent spontaneous abortion ; ectopic gestation. Given this circumstance, close clinical and laboratory surveillance advised. Timing of repeat ultrasound will in large part will depend on beta HCG values going forward. Electronically Signed   By: Bretta BangWilliam  Woodruff III M.D.   On: 10/11/2015 19:42   [place] Ob Transvaginal  10/11/2015  CLINICAL DATA:  Vaginal bleeding.  Positive beta HCG EXAM: OBSTETRIC <14 WK US AND TRANSVAGINAL OB US TECHNIQUE: Both transabdominal and transvaginal ultrasound examinations were performed for complete evaluation of the gestation as well as the maternal uterus, adnexal regions, and pelvic cul-de-sac. Transvaginal technique was performed to assess early pregnancy. COMPARISON:  None. FINDINGS: Intrauterine gestational sac: Not visualized Yolk sac:  Not visualized Embryo:  Not visualized Cardiac Activity: Not visualized Subchorionic hemorrhage:  None visualized. Maternal uterus/adnexae: No intrauterine mass is seen. Cervical os is closed. Right ovary measures with 2.2 x 1.6 x 1.1 cm in size. Left ovary measures 2.9 x 2.1 x 1.8 cm. Corpus luteum is noted in the right ovary measuring 1.5 x 1.2 x 1.3 cm. There is a small  amount of free pelvic fluid. IMPRESSION: No intrauterine mass or intrauterine gestation seen. Small amount of free pelvic fluid. No extrauterine pelvic mass beyond apparent small corpus luteum right ovary. Differential considerations given positive pregnancy test include intrauterine gestation too  early to be seen by either transabdominal or transvaginal technique; recent spontaneous abortion ; ectopic gestation. Given this circumstance, close clinical and laboratory surveillance advised. Timing of repeat ultrasound will in large part will depend on beta HCG values going forward. Electronically Signed   By: Bretta Bang III M.D.   On: 10/11/2015 19:42    Assessment:  [redacted]w[redacted]d weeks gestation   Missed abortion  Plan: The patient is instructed to follow up in in 7 days in Clinic for follow up quant.  Expect a steady decline.  Bleeding precautions reviewed  Encouraged to return here or to other Urgent Care/ED if she develops worsening of symptoms, increase in pain, fever, or other concerning symptoms.    [name] 10/13/2015, 6:23 PM

## 2015-10-14 LAB — GC/CHLAMYDIA PROBE AMP (~~LOC~~) NOT AT ARMC
CHLAMYDIA, DNA PROBE: NEGATIVE
NEISSERIA GONORRHEA: NEGATIVE

## 2015-10-18 ENCOUNTER — Other Ambulatory Visit: Payer: Self-pay

## 2015-10-18 ENCOUNTER — Telehealth: Payer: Self-pay

## 2015-10-18 NOTE — Telephone Encounter (Signed)
Patient was schedule for appointment today for repeat HCG. Patient did not show for her appointment. I attempted to call patient but there was no answer or voicemail to leave a message.

## 2015-10-21 ENCOUNTER — Ambulatory Visit: Payer: Self-pay | Admitting: *Deleted

## 2015-10-21 DIAGNOSIS — O2621 Pregnancy care for patient with recurrent pregnancy loss, first trimester: Secondary | ICD-10-CM

## 2015-10-21 DIAGNOSIS — O039 Complete or unspecified spontaneous abortion without complication: Secondary | ICD-10-CM

## 2015-10-22 LAB — HCG, QUANTITATIVE, PREGNANCY: hCG, Beta Chain, Quant, S: 128.8 m[IU]/mL — ABNORMAL HIGH

## 2015-10-29 ENCOUNTER — Other Ambulatory Visit: Payer: Self-pay

## 2015-10-29 DIAGNOSIS — O039 Complete or unspecified spontaneous abortion without complication: Secondary | ICD-10-CM

## 2015-10-29 NOTE — Progress Notes (Signed)
Patient arrived at front desk.  Requesting lab result from 10/21/15.  Chart reviewed.  Patient seen in [place] on 10/11/15 with vaginal pain and bleeding beta = 893.  Repeat on 10/13/15 beta= 544.  Repeat on 10/21/15 = 128.8.  Spoke with Judeth Horn, NP who was in clinic today.  Repeat Beta ordered.  Will phone patient with results tomorrow.

## 2015-10-30 ENCOUNTER — Telehealth: Payer: Self-pay | Admitting: *Deleted

## 2015-10-30 ENCOUNTER — Telehealth: Payer: Self-pay

## 2015-10-30 LAB — HCG, QUANTITATIVE, PREGNANCY: HCG, BETA CHAIN, QUANT, S: 3.3 m[IU]/mL

## 2015-10-30 NOTE — Telephone Encounter (Signed)
Phone call to patient with Delphina Cahill as the Spanish interpreter.  No answer, left message to return our call.  Patient needs to be informed her lab results from yesterday 10/29/15 are normal.  No need for additional lab draws.  If she is interested in birth control we will need to schedule her an appointment with a provider.  If she wants to try to get pregnant again, she should wait for at least 2 cycles before trying.

## 2015-10-30 NOTE — Telephone Encounter (Signed)
Patient called back gave her results regarding her labs she understood, she is going to try and get pregnant again in 2 months she will use condoms for now.

## 2015-11-04 NOTE — Telephone Encounter (Signed)
[facility] with interpreter Dorita and notified her of results and reccomendations. States went to health department and made appointment for birth control. She voices understanding.

## 2016-08-12 ENCOUNTER — Encounter (INDEPENDENT_AMBULATORY_CARE_PROVIDER_SITE_OTHER): Payer: Self-pay | Admitting: Physician Assistant

## 2016-08-12 ENCOUNTER — Ambulatory Visit (INDEPENDENT_AMBULATORY_CARE_PROVIDER_SITE_OTHER): Payer: Self-pay | Admitting: Physician Assistant

## 2016-08-12 VITALS — BP 116/72 | HR 49 | Temp 97.8°F | Ht 60.0 in | Wt 139.6 lb

## 2016-08-12 DIAGNOSIS — H9202 Otalgia, left ear: Secondary | ICD-10-CM

## 2016-08-12 DIAGNOSIS — H6123 Impacted cerumen, bilateral: Secondary | ICD-10-CM

## 2016-08-12 DIAGNOSIS — J301 Allergic rhinitis due to pollen: Secondary | ICD-10-CM

## 2016-08-12 DIAGNOSIS — H1013 Acute atopic conjunctivitis, bilateral: Secondary | ICD-10-CM

## 2016-08-12 MED ORDER — NAPROXEN 500 MG PO TABS: 500.0000 mg | ORAL_TABLET | Freq: Two times a day (BID) | ORAL | 0 refills | Status: DC

## 2016-08-12 MED ORDER — OLOPATADINE HCL 0.2 % OP SOLN: 1.0000 [drp] | Freq: Every day | OPHTHALMIC | 0 refills | Status: DC

## 2016-08-12 MED ORDER — CARBAMIDE PEROXIDE 6.5 % OT SOLN: 5.0000 [drp] | Freq: Two times a day (BID) | OTIC | 0 refills | Status: DC

## 2016-08-12 MED ORDER — CETIRIZINE HCL 10 MG PO TABS: 10.0000 mg | ORAL_TABLET | Freq: Every day | ORAL | 2 refills | Status: DC

## 2016-08-12 NOTE — Patient Instructions (Signed)
Acumulacin de cera en el odo  (Earwax Buildup)  Los odos producen una sustancia llamada cera. Tambin se puede llamar cerumen. A veces, se acumula demasiada cera en el canal del odo. Esto puede causar dolor de odos y dificultar la audicin.  CAUSAS  Esta afeccin es causada por demasiada produccin o acumulacin de cera.  FACTORES DE RIESGO  Los siguientes factores pueden hacer que usted sea propenso a sufrir esta afeccin:   Limpiarse con frecuencia las orejas con hisopos.   Tener los canales auditivos estrechos.   Tener cera que es demasiado densa o pegajosa.   Tener eccema.   Estar deshidratado.  SNTOMAS  Los sntomas de esta afeccin incluyen lo siguiente:   Disminucin de la audicin.   Secrecin del odo.   Dolor de odo.   Picazn en el odo.   Una sensacin de que el odo est lleno u obstruido.   Zumbidos en el odo.   Tos.  DIAGNSTICO  El mdico puede diagnosticar esta afeccin en funcin de los sntomas y la historia clnica. El mdico tambin har un examen del odo y mirar el interior del odo con un dispositivo llamado otoscopio. Tambin pueden hacerle un estudio de audicin.  TRATAMIENTO  El tratamiento de esta afeccin incluye lo siguiente:   Gotas ticas de venta libre o recetadas para ablandar la cera.   Extraccin de cera por un mdico. Esto se puede realizar de las siguientes maneras:  ? Purgar el odo con agua a la temperatura del cuerpo.  ? Con un instrumento mdico que tiene un rulo en el extremo (cureta para la cera).  ? Con un dispositivo para aspirar.  INSTRUCCIONES PARA EL CUIDADO EN EL HOGAR   Tome los medicamentos de venta libre y los recetados solamente como se lo haya indicado el mdico.   No introduzca ningn objeto en su odo, ni siquiera hisopos. La abertura del canal auditivo se puede limpiar con un pao.   Beba suficiente agua para mantener la orina clara o de color amarillo plido.   Si tiene con frecuencia acumulacin de cera en el odo o usa audfonos,  concurra a su mdico cada 6 o 12 meses para una rutina preventiva de limpieza del odo. Concurra a todas las visitas de control como se lo haya indicado el mdico.  SOLICITE ATENCIN MDICA SI:   Siente dolor de odos.   La afeccin no mejora con tratamiento.   Tiene prdida de la audicin.   Observa sangre, pus u otro lquido que salen del odo.  Esta informacin no tiene como fin reemplazar el consejo del mdico. Asegrese de hacerle al mdico cualquier pregunta que tenga.  Document Released: 03/23/2005 Document Revised: 07/15/2015  Elsevier Interactive Patient Education  2017 [employer] Inc[employer]

## 2016-08-12 NOTE — Progress Notes (Signed)
Subjective:  Patient ID: Brittney Floyd, female    DOB: Sep 05, 1980  Age: 36 y.o. MRN: [id number]  CC: left ear pain  HPI Brittney Floyd is a 36 y.o. female with a PMH of chlamydia presents with left ear discomfort and itchy/watery eyes x1 week. Has tried OTC ear drops without any relief. Does not feel ill, no close contacts with the same. Denies seasonal allergies. Denies f/c/n/v, HA, eye pain, sinus pain, throat pain, rash, SOB, or CP.     ROS Review of Systems  Constitutional: Negative for chills, fever and malaise/fatigue.  HENT: Positive for ear pain.   Eyes: Negative for blurred vision.  Respiratory: Negative for shortness of breath.   Cardiovascular: Negative for chest pain and palpitations.  Gastrointestinal: Negative for abdominal pain and nausea.  Genitourinary: Negative for dysuria and hematuria.  Musculoskeletal: Negative for joint pain and myalgias.  Skin: Negative for rash.  Neurological: Negative for tingling and headaches.  Psychiatric/Behavioral: Negative for depression. The patient is not nervous/anxious.     Objective:  BP 116/72 (BP Location: Left Arm, Patient Position: Sitting, Cuff Size: Normal)   Pulse (!) 49   Temp 97.8 F (36.6 C) (Oral)   Ht 5' (1.524 m)   Wt 139 lb 9.6 oz (63.3 kg)   LMP 08/12/2015 (Exact Date)   SpO2 100%   Breastfeeding? No   BMI 27.26 kg/m   BP/Weight 08/12/2016 10/13/2015 10/11/2015  Systolic BP 116 102 105  Diastolic BP 72 61 60  Wt. (Lbs) 139.6 - 128.4  BMI 27.26 - 25.08      Physical Exam  Constitutional: She is oriented to person, place, and time.  Well developed, well nourished, NAD, polite  HENT:  Head: Normocephalic and atraumatic.  Cerumen impaction bilaterally.  Mild postnasal drip. No sinus TTP  Eyes: Conjunctivae are normal. Right eye exhibits no discharge. Left eye exhibits no discharge. No scleral icterus.  Neck: Normal range of motion. Neck supple.  Cardiovascular: Normal rate, regular  rhythm and normal heart sounds.   Pulmonary/Chest: Effort normal and breath sounds normal.  Musculoskeletal: She exhibits no edema.  Lymphadenopathy:    She has no cervical adenopathy.  Neurological: She is alert and oriented to person, place, and time.  Skin: Skin is warm and dry. No rash noted. No erythema. No pallor.  Psychiatric: She has a normal mood and affect. Her behavior is normal. Thought content normal.  Vitals reviewed.    Assessment & Plan:   1. Left ear pain - Begin naproxen (NAPROSYN) 500 MG tablet; Take 1 tablet (500 mg total) by mouth 2 (two) times daily with a meal.  Dispense: 30 tablet; Refill: 0  2. Bilateral impacted cerumen - Ear Lavage unsuccesful today. Pt will need to apply Debrox over the next several days and return for another ear lavage.  - Begin carbamide peroxide (DEBROX) 6.5 % otic solution; Place 5 drops into both ears 2 (two) times daily.  Dispense: 15 mL; Refill: 0  3. Allergic conjunctivitis of both eyes - Begin Olopatadine HCl 0.2 % SOLN; Apply 1 drop to eye daily.  Dispense: 1 Bottle; Refill: 0  4. Seasonal allergic rhinitis due to pollen - Begin cetirizine (ZYRTEC) 10 MG tablet; Take 1 tablet (10 mg total) by mouth daily.  Dispense: 30 tablet; Refill: 2   Meds ordered this encounter  Medications  . carbamide peroxide (DEBROX) 6.5 % otic solution    Sig: Place 5 drops into both ears 2 (two) times daily.  Dispense:  15 mL    Refill:  0    Order Specific Question:   Supervising Provider    Answer:   Quentin AngstJEGEDE, OLUGBEMIGA E [id number]]  . Olopatadine HCl 0.2 % SOLN    Sig: Apply 1 drop to eye daily.    Dispense:  1 Bottle    Refill:  0    Order Specific Question:   Supervising Provider    Answer:   Quentin AngstJEGEDE, OLUGBEMIGA E [id number]]  . cetirizine (ZYRTEC) 10 MG tablet    Sig: Take 1 tablet (10 mg total) by mouth daily.    Dispense:  30 tablet    Refill:  2    Order Specific Question:   Supervising Provider    Answer:   Quentin AngstJEGEDE, OLUGBEMIGA E  [id number]]  . naproxen (NAPROSYN) 500 MG tablet    Sig: Take 1 tablet (500 mg total) by mouth 2 (two) times daily with a meal.    Dispense:  30 tablet    Refill:  0    Order Specific Question:   Supervising Provider    Answer:   Quentin AngstJEGEDE, OLUGBEMIGA E [[id number]]    Follow-up: Return in about 1 week (around 08/19/2016) for ear lavage.   Loletta Specteroger David Margart Zemanek PA

## 2016-08-19 ENCOUNTER — Ambulatory Visit (INDEPENDENT_AMBULATORY_CARE_PROVIDER_SITE_OTHER): Payer: Self-pay | Admitting: Physician Assistant

## 2016-09-08 IMAGING — US US OB TRANSVAGINAL
1 series · 15 of 28 positions shown · non-contrast
Comparison: None.

CLINICAL DATA: Vaginal bleeding.  Positive beta HCG

EXAM:
OBSTETRIC <14 WK US AND TRANSVAGINAL OB US
TECHNIQUE: Both transabdominal and transvaginal ultrasound examinations were
performed for complete evaluation of the gestation as well as the
maternal uterus, adnexal regions, and pelvic cul-de-sac.
Transvaginal technique was performed to assess early pregnancy.

[Series 1: us ob transvaginal · 15 of 52 slices shown]
[im 1/52]
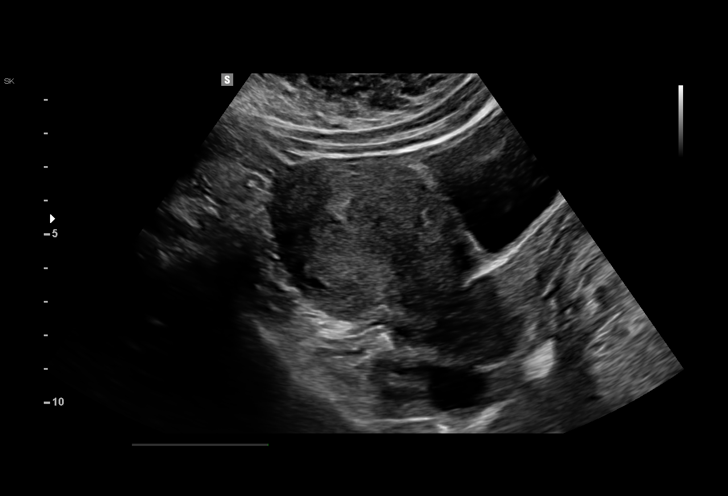
[im 4/52]
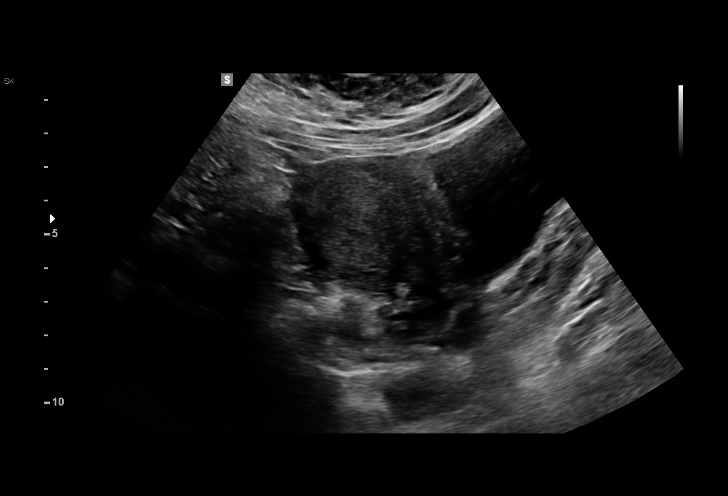
[im 8/52]
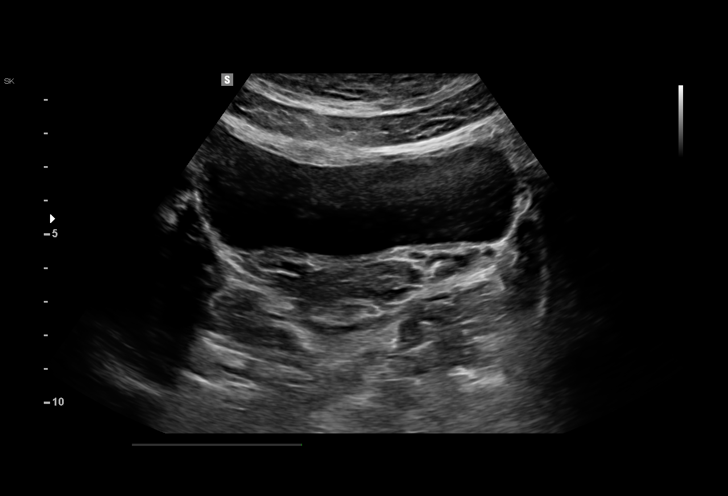
[im 12/52]
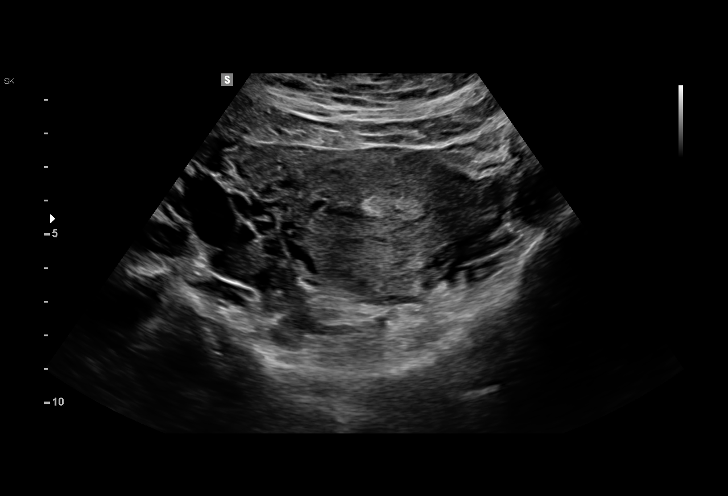
[im 16/52]
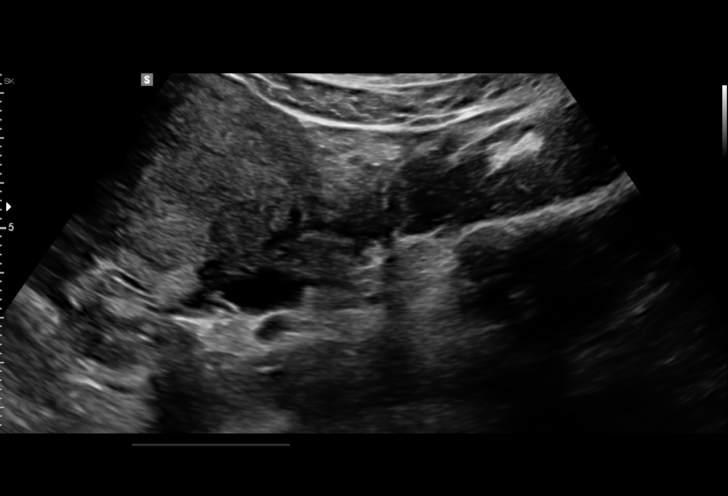
[im 19/52]
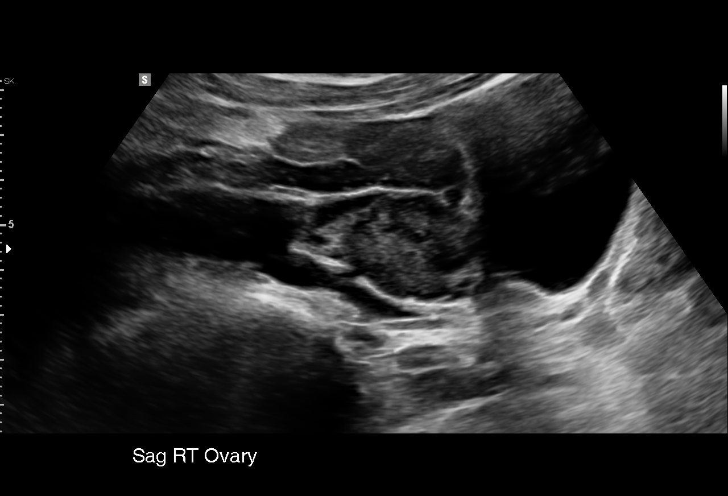
[im 23/52]
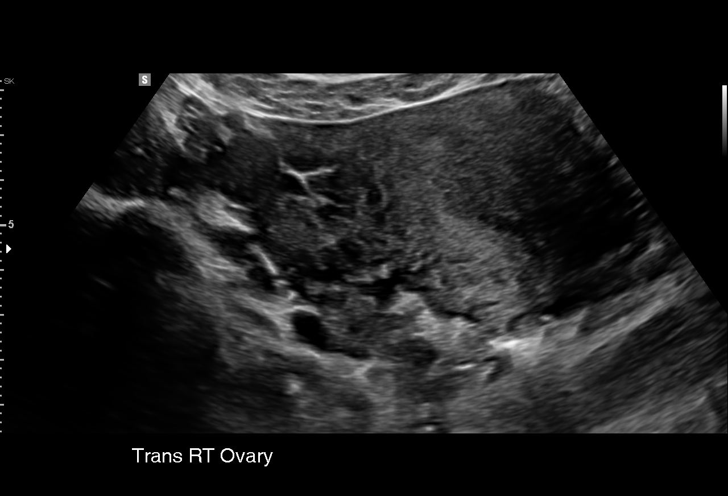
[im 27/52]
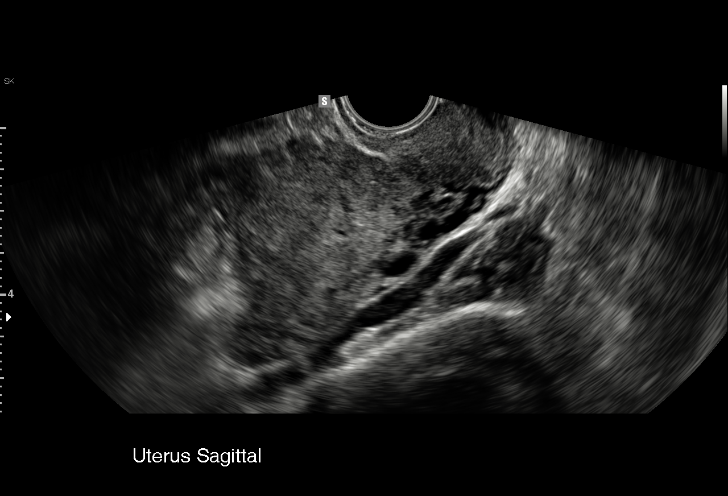
[im 29/52]
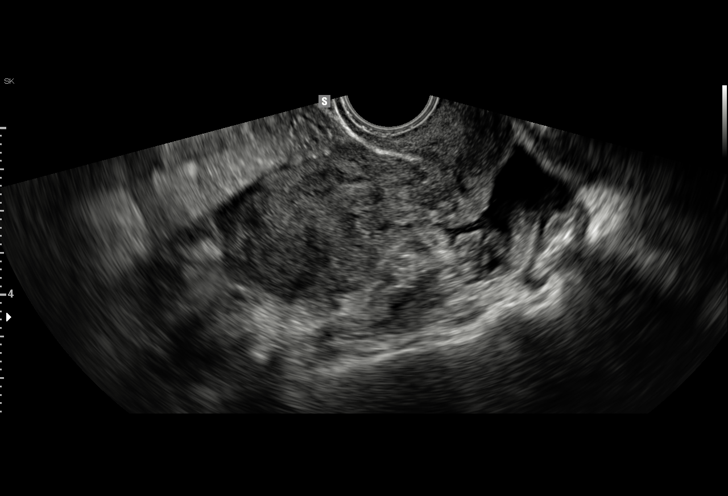
[im 33/52]
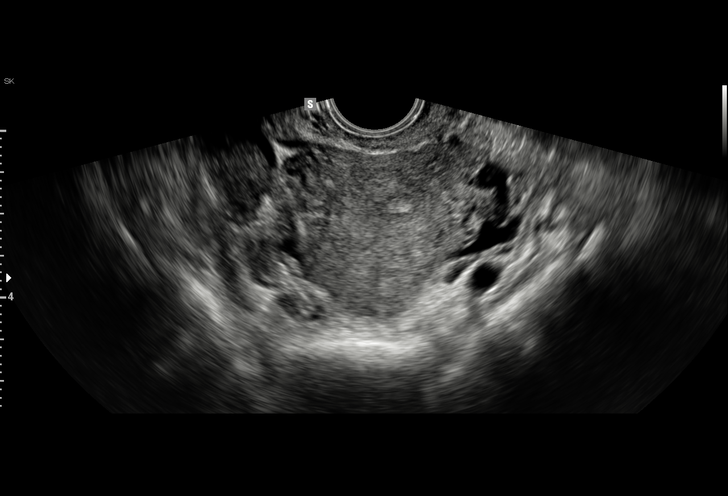
[im 36/52]
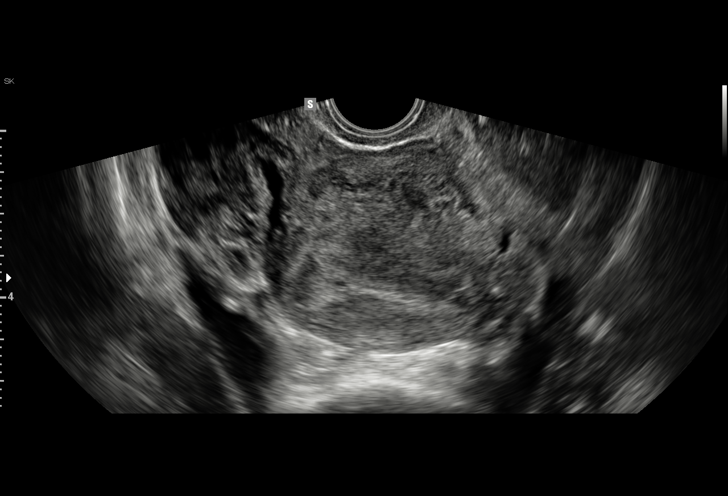
[im 40/52]
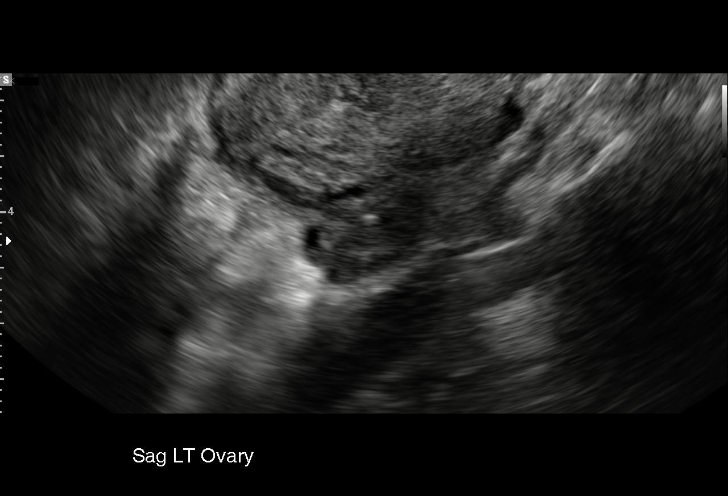
[im 44/52]
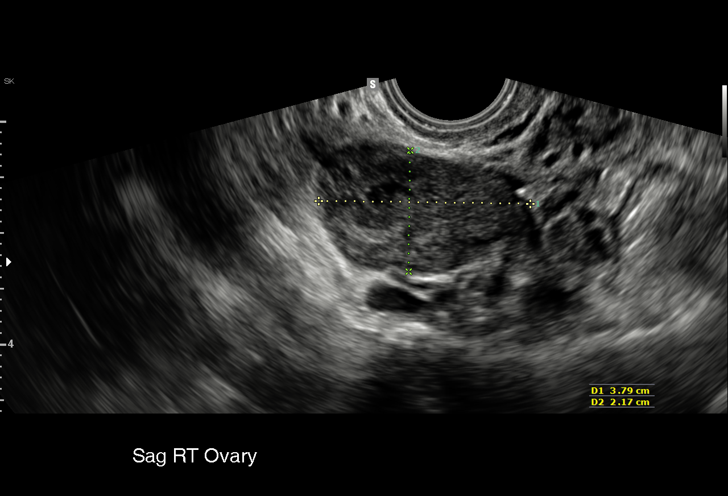
[im 48/52]
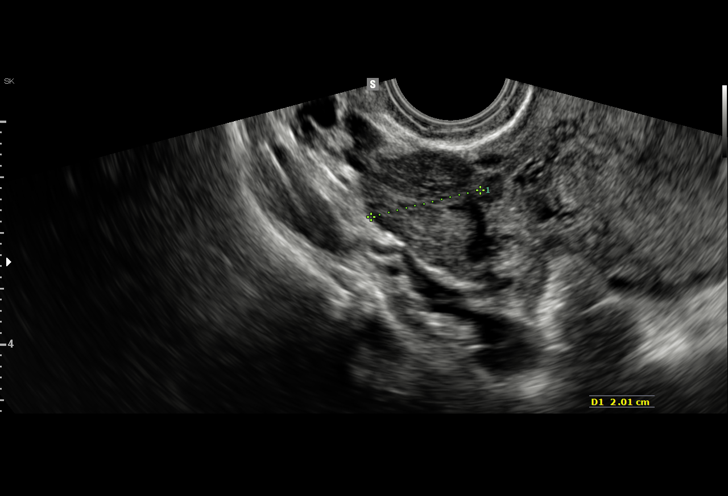
[im 52/52]
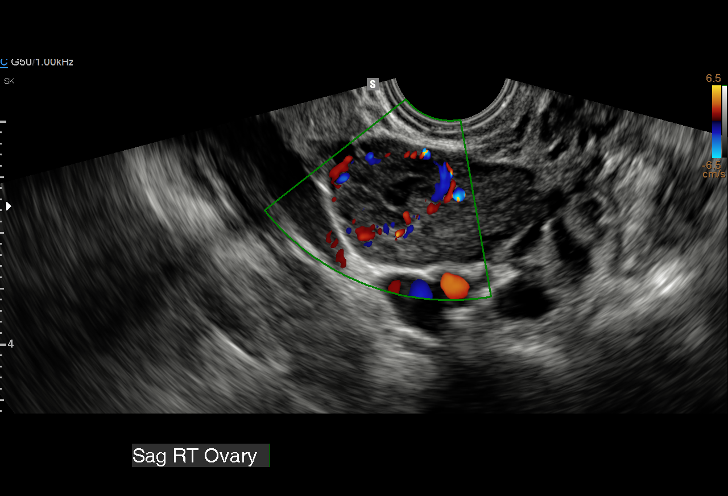

[15 of 28 positions shown; findings below may reference images not displayed]

FINDINGS: Intrauterine gestational sac: Not visualized

Yolk sac:  Not visualized

Embryo:  Not visualized

Cardiac Activity: Not visualized

Subchorionic hemorrhage:  None visualized.

Maternal uterus/adnexae: No intrauterine mass is seen. Cervical os
is closed. Right ovary measures with 2.2 x 1.6 x 1.1 cm in size.
Left ovary measures 2.9 x 2.1 x 1.8 cm. Corpus luteum is noted in
the right ovary measuring 1.5 x 1.2 x 1.3 cm. There is a small
amount of free pelvic fluid.
IMPRESSION: No intrauterine mass or intrauterine gestation seen. Small amount of
free pelvic fluid. No extrauterine pelvic mass beyond apparent small
corpus luteum right ovary. Differential considerations given
positive pregnancy test include intrauterine gestation too early to
be seen by either transabdominal or transvaginal technique; recent
spontaneous abortion ; ectopic gestation. Given this circumstance,
close clinical and laboratory surveillance advised. Timing of repeat
ultrasound will in large part will depend on beta HCG values going
forward.

## 2016-11-12 ENCOUNTER — Encounter (HOSPITAL_COMMUNITY): Payer: Self-pay | Admitting: Emergency Medicine

## 2016-11-12 ENCOUNTER — Emergency Department (HOSPITAL_COMMUNITY)
Admission: EM | Admit: 2016-11-12 | Discharge: 2016-11-12 | Disposition: A | Discharge status: Home / Self Care | Payer: No Typology Code available for payment source | Attending: Emergency Medicine | Admitting: Emergency Medicine

## 2016-11-12 DIAGNOSIS — S199XXA Unspecified injury of neck, initial encounter: Secondary | ICD-10-CM | POA: Diagnosis present

## 2016-11-12 DIAGNOSIS — Y9389 Activity, other specified: Secondary | ICD-10-CM | POA: Insufficient documentation

## 2016-11-12 DIAGNOSIS — R0789 Other chest pain: Secondary | ICD-10-CM

## 2016-11-12 DIAGNOSIS — S161XXA Strain of muscle, fascia and tendon at neck level, initial encounter: Secondary | ICD-10-CM | POA: Insufficient documentation

## 2016-11-12 DIAGNOSIS — Y92410 Unspecified street and highway as the place of occurrence of the external cause: Secondary | ICD-10-CM | POA: Insufficient documentation

## 2016-11-12 DIAGNOSIS — Y999 Unspecified external cause status: Secondary | ICD-10-CM | POA: Diagnosis not present

## 2016-11-12 MED ORDER — CYCLOBENZAPRINE HCL 10 MG PO TABS: 10.0000 mg | ORAL_TABLET | Freq: Two times a day (BID) | ORAL | 0 refills | Status: DC | PRN

## 2016-11-12 MED ORDER — IBUPROFEN 600 MG PO TABS: 600.0000 mg | ORAL_TABLET | Freq: Four times a day (QID) | ORAL | 0 refills | Status: DC | PRN
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
## 2016-11-12 NOTE — Discharge Instructions (Signed)
Take Ibuprofen three times daily for the next week. Take this medicine with food. °Take muscle relaxer at bedtime to help you sleep. This medicine makes you drowsy so do not take before driving or work °Use a heating pad for sore muscles - use for 20 minutes several times a day °Return for worsening symptoms ° °
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
## 2017-01-11 LAB — OB RESULTS CONSOLE RPR: RPR: NONREACTIVE

## 2017-01-11 LAB — OB RESULTS CONSOLE HEPATITIS B SURFACE ANTIGEN: HEP B S AG: NEGATIVE

## 2017-01-11 LAB — OB RESULTS CONSOLE HIV ANTIBODY (ROUTINE TESTING): HIV: NONREACTIVE

## 2017-01-11 LAB — OB RESULTS CONSOLE ANTIBODY SCREEN: Antibody Screen: NEGATIVE

## 2017-01-11 LAB — OB RESULTS CONSOLE RUBELLA ANTIBODY, IGM: Rubella: IMMUNE

## 2017-01-11 LAB — OB RESULTS CONSOLE ABO/RH: RH Type: POSITIVE

## 2017-01-19 ENCOUNTER — Other Ambulatory Visit: Payer: Self-pay

## 2017-02-08 ENCOUNTER — Other Ambulatory Visit (HOSPITAL_COMMUNITY): Payer: Self-pay | Admitting: Nurse Practitioner

## 2017-02-08 DIAGNOSIS — Z3689 Encounter for other specified antenatal screening: Secondary | ICD-10-CM

## 2017-02-08 DIAGNOSIS — Z3A18 18 weeks gestation of pregnancy: Secondary | ICD-10-CM

## 2017-02-08 DIAGNOSIS — O09522 Supervision of elderly multigravida, second trimester: Secondary | ICD-10-CM

## 2017-02-24 ENCOUNTER — Encounter (HOSPITAL_COMMUNITY): Payer: Self-pay | Admitting: Physician Assistant

## 2017-03-04 ENCOUNTER — Encounter (HOSPITAL_COMMUNITY): Payer: Self-pay | Admitting: *Deleted

## 2017-03-05 ENCOUNTER — Ambulatory Visit (HOSPITAL_COMMUNITY)
Admission: RE | Admit: 2017-03-05 | Discharge: 2017-03-05 | Disposition: A | Discharge status: Home / Self Care | Payer: No Typology Code available for payment source | Source: Ambulatory Visit | Attending: Nurse Practitioner | Admitting: Nurse Practitioner

## 2017-03-05 ENCOUNTER — Encounter (HOSPITAL_COMMUNITY): Payer: Self-pay

## 2017-03-05 DIAGNOSIS — O09522 Supervision of elderly multigravida, second trimester: Secondary | ICD-10-CM

## 2017-03-05 DIAGNOSIS — Z8489 Family history of other specified conditions: Secondary | ICD-10-CM | POA: Insufficient documentation

## 2017-03-05 DIAGNOSIS — Z3689 Encounter for other specified antenatal screening: Secondary | ICD-10-CM | POA: Insufficient documentation

## 2017-03-05 DIAGNOSIS — Z3A18 18 weeks gestation of pregnancy: Secondary | ICD-10-CM | POA: Insufficient documentation

## 2017-03-05 DIAGNOSIS — Z363 Encounter for antenatal screening for malformations: Secondary | ICD-10-CM | POA: Insufficient documentation

## 2017-03-05 HISTORY — DX: Intrahepatic cholestasis of pregnancy, unspecified trimester: O26.649

## 2017-03-05 HISTORY — DX: Obstruction of bile duct: K83.1

## 2017-03-05 HISTORY — DX: Anemia, unspecified: D64.9

## 2017-03-05 HISTORY — DX: Liver and biliary tract disorders in pregnancy, unspecified trimester: O26.619

## 2017-04-06 NOTE — L&D Delivery Note (Addendum)
Delivery Note At 1:50 AM a viable and healthy female was delivered after AROM via Vaginal, Spontaneous (Presentation: cephalid; LOA  ).  APGAR: 9,9  Placenta status: spontaneous, intact .  Cord: 3 vessel Complications: none  Anesthesia:  IV pain meds Episiotomy: None Lacerations: None Suture Repair: N/a Est. Blood Loss (mL): 100  Mom to postpartum.  Baby to Couplet care / Skin to Skin.  Myrene BuddyJacob Fletcher 07/28/2017, 2:02 AM  I was gloved, present and supervised delivery in its entirety   Sharyon CableVeronica C Rui Wordell, CNM 07/28/17, 2:13 AM

## 2017-04-12 ENCOUNTER — Encounter (HOSPITAL_COMMUNITY): Payer: Self-pay

## 2017-07-08 LAB — OB RESULTS CONSOLE GC/CHLAMYDIA
Chlamydia: NEGATIVE
Gonorrhea: NEGATIVE

## 2017-07-08 LAB — OB RESULTS CONSOLE GBS: GBS: NEGATIVE

## 2017-07-20 ENCOUNTER — Other Ambulatory Visit: Payer: Self-pay

## 2017-07-20 ENCOUNTER — Inpatient Hospital Stay (HOSPITAL_COMMUNITY)
Admission: AD | Admit: 2017-07-20 | Discharge: 2017-07-20 | Disposition: A | Discharge status: Home / Self Care | Payer: Medicaid Other | Source: Ambulatory Visit | Attending: Obstetrics and Gynecology | Admitting: Obstetrics and Gynecology

## 2017-07-20 ENCOUNTER — Encounter (HOSPITAL_COMMUNITY): Payer: Self-pay

## 2017-07-20 DIAGNOSIS — Z3A38 38 weeks gestation of pregnancy: Secondary | ICD-10-CM | POA: Insufficient documentation

## 2017-07-20 DIAGNOSIS — K831 Obstruction of bile duct: Secondary | ICD-10-CM | POA: Insufficient documentation

## 2017-07-20 DIAGNOSIS — Z880 Allergy status to penicillin: Secondary | ICD-10-CM | POA: Diagnosis not present

## 2017-07-20 DIAGNOSIS — O99013 Anemia complicating pregnancy, third trimester: Secondary | ICD-10-CM | POA: Insufficient documentation

## 2017-07-20 DIAGNOSIS — O368130 Decreased fetal movements, third trimester, not applicable or unspecified: Secondary | ICD-10-CM | POA: Diagnosis present

## 2017-07-20 DIAGNOSIS — O26613 Liver and biliary tract disorders in pregnancy, third trimester: Secondary | ICD-10-CM | POA: Insufficient documentation

## 2017-07-20 NOTE — Discharge Instructions (Signed)
Evaluacin de los movimientos fetales  Fetal Movement Counts  Introduccin  Nombre del paciente: ________________________________________________ Fecha de parto estimada: ____________________  Qu es una evaluacin de los movimientos fetales?  Una evaluacin de los movimientos fetales es el registro del nmero de veces que siente que el beb se mueve durante un cierto perodo de tiempo. Esto tambin se puede denominar recuento de patadas fetales. Una evaluacin de movimientos fetales se recomienda a todas las embarazadas. Es posible que le indiquen que comience a evaluar los movimientos fetales desde la semana 28 de embarazo.  Preste atencin cuando sienta que el beb est ms activo. Podr detectar los ciclos en que el beb duerme y est despierto. Tambin podr detectar que ciertas cosas hacen que su beb se mueva ms. Deber realizar una evaluacin de los movimientos fetales en las siguientes situaciones:   Cuando el beb est ms activo habitualmente.   A la misma hora, todos los das.    Un buen momento para evaluar los movimientos fetales es cuando est descansando, despus de haber comido y bebido algo.  Cmo debo contar los movimientos fetales?  1. Encuentre un lugar tranquilo y cmodo. Sintese o acustese de lado.  2. Anote la fecha, la hora de inicio y de finalizacin y la cantidad de movimientos que sinti entre esas dos horas. Lleve esta informacin a las visitas de control.  3. Cuente las pataditas, revoloteos, chasquidos, vueltas o pinchazos en un perodo de 2horas. Debe sentir al menos 10movimientos en 2horas.  4. Cuando sienta 10movimientos, puede dejar de contar.  5. Si no siente 10movimientos en 2horas, coma y beba algo. Luego, contine descansando y contando durante 1hora. Si siente al menos 4movimientos durante esa hora, puede dejar de contar.  Comunquese con un mdico si:   Siente menos de 4movimientos en 2horas.   El beb no se mueve tanto como suele hacerlo.  Fecha:  ____________ Hora de inicio: ____________ Hora de finalizacin: ____________ Movimientos: ____________  Fecha: ____________ Hora de inicio: ____________ Hora de finalizacin: ____________ Movimientos: ____________  Fecha: ____________ Hora de inicio: ____________ Hora de finalizacin: ____________ Movimientos: ____________  Fecha: ____________ Hora de inicio: ____________ Hora de finalizacin: ____________ Movimientos: ____________  Fecha: ____________ Hora de inicio: ____________ Hora de finalizacin: ____________ Movimientos: ____________  Fecha: ____________ Hora de inicio: ____________ Hora de finalizacin: ____________ Movimientos: ____________  Fecha: ____________ Hora de inicio: ____________ Hora de finalizacin: ____________ Movimientos: ____________  Fecha: ____________ Hora de inicio: ____________ Hora de finalizacin: ____________ Movimientos: ____________  Fecha: ____________ Hora de inicio: ____________ Hora de finalizacin: ____________ Movimientos: ____________  Esta informacin no tiene como fin reemplazar el consejo del mdico. Asegrese de hacerle al mdico cualquier pregunta que tenga.  Document Released: 06/30/2007 Document Revised: 06/26/2016 Document Reviewed: 05/02/2015  Elsevier Interactive Patient Education  2018 Elsevier Inc.

## 2017-07-20 NOTE — MAU Note (Signed)
Pt states that the baby has been moving less than he normally does. She states the last time she felt baby move was at [id number]. She tried drinking juice and sweet cereal.    Denies vaginal bleeding or LOF

## 2017-07-20 NOTE — MAU Provider Note (Signed)
History     CSN: [id number]  Arrival date and time: 07/20/17 2159   None     Chief Complaint  Patient presents with  . Decreased Fetal Movement   HPI 37 yo G4P2012 at [id number] here for the evaluation of decrease fetal movement. Patient reports feeling some movement but not as much as previously. She drank a sweet beverage without much improvement. She reports last fetal movement around 6:30pm. Patient with uncomplicated [facility].  OB History    Gravida  4   Para  2   Term  2   Preterm      AB  1   Living  2     SAB  1   TAB      Ectopic      Multiple      Live Births  2           Past Medical History:  Diagnosis Date  . Anemia   . Chlamydia   . Cholestasis during pregnancy     Past Surgical History:  Procedure Laterality Date  . COLPOSCOPY      Family History  Problem Relation Age of Onset  . Heart disease Father   . Hepatitis Father   . Asthma Paternal Grandmother     Social History   Tobacco Use  . Smoking status: Never Smoker  . Smokeless tobacco: Never Used  Substance Use Topics  . Alcohol use: No  . Drug use: No    Allergies:  Allergies  Allergen Reactions  . Penicillins Rash    Has patient had a PCN reaction causing immediate rash, facial/tongue/throat swelling, SOB or lightheadedness with hypotension: Yes Has patient had a PCN reaction causing severe rash involving mucus membranes or skin necrosis: No Has patient had a PCN reaction that required hospitalization No Has patient had a PCN reaction occurring within the last 10 years: Yes If all of the above answers are "NO", then may proceed with Cephalosporin use.     Medications Prior to Admission  Medication Sig Dispense Refill Last Dose  . carbamide peroxide (DEBROX) 6.5 % otic solution Place 5 drops into both ears 2 (two) times daily. (Patient not taking: Reported on 03/05/2017) 15 mL 0 Not Taking  . cetirizine (ZYRTEC) 10 MG tablet Take 1  tablet (10 mg total) by mouth daily. (Patient not taking: Reported on 03/05/2017) 30 tablet 2 Not Taking  . cyclobenzaprine (FLEXERIL) 10 MG tablet Take 1 tablet (10 mg total) by mouth 2 (two) times daily as needed for muscle spasms. (Patient not taking: Reported on 03/05/2017) 20 tablet 0 Not Taking  . ibuprofen (ADVIL,MOTRIN) 600 MG tablet Take 1 tablet (600 mg total) by mouth every 6 (six) hours as needed. (Patient not taking: Reported on 03/05/2017) 30 tablet 0 Not Taking  . naproxen (NAPROSYN) 500 MG tablet Take 1 tablet (500 mg total) by mouth 2 (two) times daily with a meal. (Patient not taking: Reported on 03/05/2017) 30 tablet 0 Not Taking  . Olopatadine HCl 0.2 % SOLN Apply 1 drop to eye daily. (Patient not taking: Reported on 03/05/2017) 1 Bottle 0 Not Taking  . Prenatal Vit-Fe Fumarate-FA (PRENATAL MULTIVITAMIN) TABS tablet Take 1 tablet by mouth daily at 12 noon.   Taking    Review of Systems  See pertinent in HPI Physical Exam   Blood pressure (!) 120/58, pulse 74, temperature 98.8 F (37.1 C), resp. rate 20, last menstrual period 10/26/2016, SpO2 98 %.  Physical Exam  GENERAL: Well-developed, well-nourished female in no acute distress.  HEENT: Normocephalic, atraumatic. Sclerae anicteric.  NECK: Supple. Normal thyroid.  LUNGS: Clear to auscultation bilaterally.  HEART: Regular rate and rhythm. BREASTS: Symmetric in size. No palpable masses or lymphadenopathy, skin changes, or nipple drainage. ABDOMEN: Soft, nontender, gravid PELVIC: Not indicated EXTREMITIES: No cyanosis, clubbing, or edema, 2+ distal pulses.  FHT: baseline 120, mod variability, +accels, no decels Toco: irregular contractions MAU Course  Procedures  MDM   Assessment and Plan  37 yo G4P2012 at [redacted]w[redacted]d with decreased fetal movement - Patient reports increased fetal movement since 10:30pm - NST reviewed and reactive - Reassurance provided to the patient - Encouraged her to monitor kick counts - Follow  up with health department as scheduled on 4/18 - Labor precautions reviewed  Brittney Floyd 07/20/2017, 11:05 PM

## 2017-07-26 ENCOUNTER — Other Ambulatory Visit: Payer: Self-pay | Admitting: Family Medicine

## 2017-07-27 ENCOUNTER — Inpatient Hospital Stay (HOSPITAL_COMMUNITY)
Admission: RE | Admit: 2017-07-27 | Discharge: 2017-07-29 | DRG: 807 | Disposition: A | Discharge status: Home / Self Care | Payer: Medicaid Other | Source: Ambulatory Visit | Attending: Obstetrics and Gynecology | Admitting: Obstetrics and Gynecology

## 2017-07-27 DIAGNOSIS — Z3490 Encounter for supervision of normal pregnancy, unspecified, unspecified trimester: Secondary | ICD-10-CM | POA: Diagnosis present

## 2017-07-27 DIAGNOSIS — O321XX0 Maternal care for breech presentation, not applicable or unspecified: Secondary | ICD-10-CM | POA: Diagnosis present

## 2017-07-27 DIAGNOSIS — Z3A39 39 weeks gestation of pregnancy: Secondary | ICD-10-CM | POA: Diagnosis not present

## 2017-07-27 DIAGNOSIS — Z880 Allergy status to penicillin: Secondary | ICD-10-CM

## 2017-07-27 LAB — TYPE AND SCREEN
ABO/RH(D): O POS
Antibody Screen: NEGATIVE

## 2017-07-27 LAB — CBC
HCT: 36 % (ref 36.0–46.0)
Hemoglobin: 11.4 g/dL — ABNORMAL LOW (ref 12.0–15.0)
MCH: 22 pg — ABNORMAL LOW (ref 26.0–34.0)
MCHC: 31.7 g/dL (ref 30.0–36.0)
MCV: 69.5 fL — AB (ref 78.0–100.0)
PLATELETS: 271 10*3/uL (ref 150–400)
RBC: 5.18 MIL/uL — ABNORMAL HIGH (ref 3.87–5.11)
RDW: 18 % — AB (ref 11.5–15.5)
WBC: 9.8 10*3/uL (ref 4.0–10.5)

## 2017-07-27 MED ORDER — TERBUTALINE SULFATE 1 MG/ML IJ SOLN
0.2500 mg | Freq: Once | INTRAMUSCULAR | Status: AC
  Administered 2017-07-27: 0.25 mg via SUBCUTANEOUS
  Filled 2017-07-27: qty 1

## 2017-07-27 MED ORDER — LACTATED RINGERS IV SOLN
INTRAVENOUS | Status: DC
  Administered 2017-07-27: 19:00:00 via INTRAVENOUS

## 2017-07-27 MED ORDER — LACTATED RINGERS IV SOLN: INTRAVENOUS | Status: DC

## 2017-07-27 MED ORDER — OXYTOCIN 40 UNITS IN LACTATED RINGERS INFUSION - SIMPLE MED
2.5000 [IU]/h | INTRAVENOUS | Status: DC
  Filled 2017-07-27: qty 1000

## 2017-07-27 MED ORDER — LIDOCAINE HCL (PF) 1 % IJ SOLN
30.0000 mL | INTRAMUSCULAR | Status: DC | PRN
  Filled 2017-07-27: qty 30

## 2017-07-27 MED ORDER — OXYTOCIN 40 UNITS IN LACTATED RINGERS INFUSION - SIMPLE MED
1.0000 m[IU]/min | INTRAVENOUS | Status: DC
  Administered 2017-07-27: 2 m[IU]/min via INTRAVENOUS

## 2017-07-27 MED ORDER — OXYTOCIN BOLUS FROM INFUSION
500.0000 mL | Freq: Once | INTRAVENOUS | Status: AC
  Administered 2017-07-28: 500 mL via INTRAVENOUS

## 2017-07-27 MED ORDER — FENTANYL CITRATE (PF) 100 MCG/2ML IJ SOLN
INTRAMUSCULAR | Status: AC
  Administered 2017-07-27: 100 ug via INTRAVENOUS
  Filled 2017-07-27: qty 2

## 2017-07-27 MED ORDER — SOD CITRATE-CITRIC ACID 500-334 MG/5ML PO SOLN: 30.0000 mL | ORAL | Status: DC | PRN

## 2017-07-27 MED ORDER — MISOPROSTOL 25 MCG QUARTER TABLET
25.0000 ug | ORAL_TABLET | ORAL | Status: DC
  Filled 2017-07-27 (×4): qty 1

## 2017-07-27 MED ORDER — ACETAMINOPHEN 325 MG PO TABS: 650.0000 mg | ORAL_TABLET | ORAL | Status: DC | PRN

## 2017-07-27 MED ORDER — LACTATED RINGERS IV SOLN: 500.0000 mL | INTRAVENOUS | Status: DC | PRN

## 2017-07-27 MED ORDER — OXYCODONE-ACETAMINOPHEN 5-325 MG PO TABS: 1.0000 | ORAL_TABLET | ORAL | Status: DC | PRN

## 2017-07-27 MED ORDER — ONDANSETRON HCL 4 MG/2ML IJ SOLN: 4.0000 mg | Freq: Four times a day (QID) | INTRAMUSCULAR | Status: DC | PRN

## 2017-07-27 MED ORDER — OXYCODONE-ACETAMINOPHEN 5-325 MG PO TABS: 2.0000 | ORAL_TABLET | ORAL | Status: DC | PRN

## 2017-07-27 MED ORDER — TERBUTALINE SULFATE 1 MG/ML IJ SOLN
0.2500 mg | Freq: Once | INTRAMUSCULAR | Status: DC | PRN
  Filled 2017-07-27: qty 1

## 2017-07-27 MED ORDER — FENTANYL CITRATE (PF) 100 MCG/2ML IJ SOLN
100.0000 ug | INTRAMUSCULAR | Status: DC | PRN
  Administered 2017-07-27 – 2017-07-28 (×2): 100 ug via INTRAVENOUS
  Filled 2017-07-27: qty 2

## 2017-07-27 NOTE — Anesthesia Pain Management Evaluation Note (Addendum)
  [facility] Pain Management Visit Note  Patient: Brittney Floyd, 37 y.o., female  "Hello I am a member of the anesthesia team at [facility]. We have an anesthesia team available at all times to provide care throughout the hospital, including epidural management and anesthesia for C-section. I don't know your plan for the delivery whether it a natural birth, water birth, IV sedation, nitrous supplementation, doula or epidural, but we want to meet your pain goals."   1.Was your pain managed to your expectations on prior hospitalizations?   Yes   2.What is your expectation for pain management during this hospitalization?     Labor support without medications  3.How can we help you reach that goal? Nursing interventions  Record the patient's initial score and the patient's pain goal.   Pain: 3  Pain Goal: 10 [facility] wants you to be able to say your pain was always managed very well.  Brittney Floyd 07/27/2017

## 2017-07-27 NOTE — Discharge Summary (Signed)
  Patient Name: Brittney Floyd, female   DOB: July 05, 1980, 37 y.o.  MRN: [id number]  Patient here for outpatient procedure only. No discharge summary needed.

## 2017-07-27 NOTE — Progress Notes (Signed)
Subjective: Doing well with no complaints. U/S showing cephalic position.   Objective: BP 119/77   Pulse 78   Temp 98.1 F (36.7 C) (Oral)   Resp 17   Ht 5\' 1"  (1.549 m)   Wt 79.2 kg (174 lb 9.6 oz)   LMP 10/26/2016 (Exact Date)   BMI 32.99 kg/m  No intake/output data recorded. No intake/output data recorded.  FHT:  FHR: 150 bpm, variability: minimal ,  accelerations:  Abscent,  decelerations:  Absent UC:   regular, every 3-4 minutes SVE:   Dilation: 6 Effacement (%): 70 Station: -3 Exam by:: Minus Libertyhristy Leshowitz, RN   Labs: Lab Results  Component Value Date   WBC 9.8 07/27/2017   HGB 11.4 (L) 07/27/2017   HCT 36.0 07/27/2017   MCV 69.5 (L) 07/27/2017   PLT 271 07/27/2017    Assessment / Plan: IOL for variable presentation, s/p ECV. S/p foley bulb. On pit.  Labor: s/p fb, pit Preeclampsia:  N/A Fetal Wellbeing:  Category I Pain Control:  analgesia prn I/D:  n/a Anticipated MOD:  NSVD    Brittney Floyd 07/27/2017, 11:04 PM

## 2017-07-27 NOTE — Progress Notes (Signed)
Patient ID: Brittney Floyd, female   DOB: 1980-07-21, 37 y.o.   MRN: [id number]  Will keep patient after successful ECV for elective induction. Category 1 tracing.  Dilation: 1 Effacement (%): 70  Foley balloon placed.  Patient tolerated well.  Light labor diet. GBS negative  Levie HeritageStinson, Jacob J, DO 07/27/2017 1:56 PM

## 2017-07-27 NOTE — Procedures (Signed)
After informed verbal consent, Terbutaline 0.25 mg SQ given, ECV was attempted under Ultrasound guidance.  Attempted to rotate the baby clockwise, but was unsuccessful. Baby successfully rotated counterclockwise.[name].   FHR was reactive before and after the procedure.   Pt. Tolerated the procedure well.  Brittney Floyd, Brittney Mcmichael J, DO 07/27/2017, 12:27 PM

## 2017-07-27 NOTE — Progress Notes (Signed)
Subjective: Doing well with no complaints   Objective: BP 119/77   Pulse 78   Temp 98.1 F (36.7 C) (Oral)   Resp 17   Ht 5\' 1"  (1.549 m)   Wt 79.2 kg (174 lb 9.6 oz)   LMP 10/26/2016 (Exact Date)   BMI 32.99 kg/m  No intake/output data recorded. No intake/output data recorded.  FHT:  FHR: 150 bpm, variability: minimal ,  accelerations:  Abscent,  decelerations:  Absent UC:   regular, every 3-4 minutes SVE:   Dilation: 5.5 Effacement (%): 70 Station: Ballotable Exam by:: Minus Libertyhristy Leshowitz, RN  Labs: Lab Results  Component Value Date   WBC 9.8 07/27/2017   HGB 11.4 (L) 07/27/2017   HCT 36.0 07/27/2017   MCV 69.5 (L) 07/27/2017   PLT 271 07/27/2017    Assessment / Plan: IOL for variable presentation, s/p ECV. S/p foley bulb.  Labor: s/p fb, starting pit Preeclampsia:  N/A Fetal Wellbeing:  Category I Pain Control:  analgesia prn I/D:  n/a Anticipated MOD:  NSVD  Myrene BuddyJacob Amori Colomb 07/27/2017, 8:57 PM

## 2017-07-27 NOTE — H&P (Signed)
Preoperative History and Physical  Brittney Floyd is a 37 y.o. [phone] here for ECV due to breech. No other complications to pregnancy.   Proposed procedure: ECV  Past Medical History:  Diagnosis Date  . Anemia   . Chlamydia   . Cholestasis during pregnancy    Past Surgical History:  Procedure Laterality Date  . COLPOSCOPY     OB History    Gravida  4   Para  2   Term  2   Preterm      AB  1   Living  2     SAB  1   TAB      Ectopic      Multiple      Live Births  2          Patient denies any cervical dysplasia or STIs. No current facility-administered medications on file prior to encounter.    Current Outpatient Medications on File Prior to Encounter  Medication Sig Dispense Refill  . carbamide peroxide (DEBROX) 6.5 % otic solution Place 5 drops into both ears 2 (two) times daily. (Patient not taking: Reported on 03/05/2017) 15 mL 0  . cetirizine (ZYRTEC) 10 MG tablet Take 1 tablet (10 mg total) by mouth daily. (Patient not taking: Reported on 03/05/2017) 30 tablet 2  . cyclobenzaprine (FLEXERIL) 10 MG tablet Take 1 tablet (10 mg total) by mouth 2 (two) times daily as needed for muscle spasms. (Patient not taking: Reported on 03/05/2017) 20 tablet 0  . Olopatadine HCl 0.2 % SOLN Apply 1 drop to eye daily. (Patient not taking: Reported on 03/05/2017) 1 Bottle 0  . Prenatal Vit-Fe Fumarate-FA (PRENATAL MULTIVITAMIN) TABS tablet Take 1 tablet by mouth daily at 12 noon.     Allergies  Allergen Reactions  . Penicillins Rash    Has patient had a PCN reaction causing immediate rash, facial/tongue/throat swelling, SOB or lightheadedness with hypotension: Yes Has patient had a PCN reaction causing severe rash involving mucus membranes or skin necrosis: No Has patient had a PCN reaction that required hospitalization No Has patient had a PCN reaction occurring within the last 10 years: Yes If all of the above answers are "NO", then may proceed with  Cephalosporin use.    Social History:   reports that she has never smoked. She has never used smokeless tobacco. She reports that she does not drink alcohol or use drugs.  Family History  Problem Relation Age of Onset  . Heart disease Father   . Hepatitis Father   . Asthma Paternal Grandmother     Review of Systems: Full 10 systems review of systems preformed, which were normal other than what was stated in the HPI.  PHYSICAL EXAM: Height 5\' 1"  (1.549 m), weight 174 lb 9.6 oz (79.2 kg), last menstrual period 10/26/2016. General appearance - alert, well appearing, and in no distress Head - Normocephalic, atraumatic.  Right and left external ears normal. Eyes - EOMI.  Nonicteric.  Normal conjunctiva Neck - supple, no lymphadenopathy.  No tracheal deviation Chest - clear to auscultation, no wheezes, rales or rhonchi, symmetric air entry Heart - normal rate and regular rhythm Abdomen - soft, nontender, nondistended, no masses or organomegaly. Gravid to term Pelvic - examination not indicated Extremities - peripheral pulses normal, no pedal edema, no clubbing or cyanosis Skin - Warm to touch. no bruises, rashes, wounds. Neuro - Oriented x3.  Cranial nerves intact. Psych - normal thought process.  Judgement intact.  Labs: Results for orders  placed or performed during the hospital encounter of 07/27/17 (from the past 336 hour(s))  CBC   Collection Time: 07/27/17 10:10 AM  Result Value Ref Range   WBC 9.8 4.0 - 10.5 K/uL   RBC 5.18 (H) 3.87 - 5.11 MIL/uL   Hemoglobin 11.4 (L) 12.0 - 15.0 g/dL   HCT 69.636.0 29.536.0 - 28.446.0 %   MCV 69.5 (L) 78.0 - 100.0 fL   MCH 22.0 (L) 26.0 - 34.0 pg   MCHC 31.7 30.0 - 36.0 g/dL   RDW 13.218.0 (H) 44.011.5 - 10.215.5 %   Platelets 271 150 - 400 K/uL  Type and screen [facility]   Collection Time: 07/27/17 10:10 AM  Result Value Ref Range   ABO/RH(D) O POS    Antibody Screen NEG    Sample Expiration      07/30/2017 Performed at [facility], [address] [place], [place] [id number]     Imaging Studies: No results found.  Assessment: Patient Active Problem List   Diagnosis Date Noted  . Low grade squamous intraepithelial lesion (LGSIL) on cervical Pap smear 09/19/2013  . Cholestasis of pregnancy in third trimester 06/29/2013    Plan: Risks of procedure discussed with the patient, including failure, fetal intolerance, SROM, Abruption, need for emergent cesarean section.   The risks of cesarean section discussed with the patient included but were not limited to: bleeding which may require transfusion or reoperation; infection which may require antibiotics; injury to bowel, bladder, ureters or other surrounding organs; injury to the fetus; need for additional procedures including hysterectomy in the event of a life-threatening hemorrhage; placental abnormalities wth subsequent pregnancies, incisional problems, thromboembolic phenomenon and other postoperative/anesthesia complications. The patient concurred with the proposed plan, giving informed written consent for the procedure.   Patient has been NPO since last night she will remain NPO for procedure.  Levie HeritageStinson, Jacob J, DO 07/27/2017 12:06 PM

## 2017-07-28 ENCOUNTER — Encounter (HOSPITAL_COMMUNITY): Payer: Self-pay

## 2017-07-28 DIAGNOSIS — Z3A39 39 weeks gestation of pregnancy: Secondary | ICD-10-CM

## 2017-07-28 LAB — RPR: RPR: NONREACTIVE

## 2017-07-28 MED ORDER — ONDANSETRON HCL 4 MG/2ML IJ SOLN: 4.0000 mg | INTRAMUSCULAR | Status: DC | PRN

## 2017-07-28 MED ORDER — SIMETHICONE 80 MG PO CHEW: 80.0000 mg | CHEWABLE_TABLET | ORAL | Status: DC | PRN

## 2017-07-28 MED ORDER — DIPHENHYDRAMINE HCL 25 MG PO CAPS: 25.0000 mg | ORAL_CAPSULE | Freq: Four times a day (QID) | ORAL | Status: DC | PRN

## 2017-07-28 MED ORDER — WITCH HAZEL-GLYCERIN EX PADS: 1.0000 "application " | MEDICATED_PAD | CUTANEOUS | Status: DC | PRN

## 2017-07-28 MED ORDER — IBUPROFEN 600 MG PO TABS
600.0000 mg | ORAL_TABLET | Freq: Four times a day (QID) | ORAL | Status: DC
  Administered 2017-07-28 – 2017-07-29 (×5): 600 mg via ORAL
  Filled 2017-07-28 (×5): qty 1

## 2017-07-28 MED ORDER — TETANUS-DIPHTH-ACELL PERTUSSIS 5-2.5-18.5 LF-MCG/0.5 IM SUSP: 0.5000 mL | Freq: Once | INTRAMUSCULAR | Status: DC

## 2017-07-28 MED ORDER — ACETAMINOPHEN 325 MG PO TABS
650.0000 mg | ORAL_TABLET | ORAL | Status: DC | PRN
  Administered 2017-07-28 (×2): 650 mg via ORAL
  Filled 2017-07-28 (×2): qty 2

## 2017-07-28 MED ORDER — SENNOSIDES-DOCUSATE SODIUM 8.6-50 MG PO TABS
2.0000 | ORAL_TABLET | ORAL | Status: DC
  Administered 2017-07-28: 2 via ORAL
  Filled 2017-07-28: qty 2

## 2017-07-28 MED ORDER — PRENATAL MULTIVITAMIN CH
1.0000 | ORAL_TABLET | Freq: Every day | ORAL | Status: DC
  Administered 2017-07-28: 1 via ORAL
  Filled 2017-07-28: qty 1

## 2017-07-28 MED ORDER — BENZOCAINE-MENTHOL 20-0.5 % EX AERO
1.0000 | INHALATION_SPRAY | CUTANEOUS | Status: DC | PRN
Start: 2017-07-28 — End: 2017-07-29

## 2017-07-28 MED ORDER — ONDANSETRON HCL 4 MG PO TABS: 4.0000 mg | ORAL_TABLET | ORAL | Status: DC | PRN

## 2017-07-28 MED ORDER — COCONUT OIL OIL: 1.0000 "application " | TOPICAL_OIL | Status: DC | PRN

## 2017-07-28 MED ORDER — ZOLPIDEM TARTRATE 5 MG PO TABS: 5.0000 mg | ORAL_TABLET | Freq: Every evening | ORAL | Status: DC | PRN

## 2017-07-28 MED ORDER — DIBUCAINE 1 % RE OINT: 1.0000 "application " | TOPICAL_OINTMENT | RECTAL | Status: DC | PRN

## 2017-07-28 NOTE — Lactation Note (Signed)
This note was copied from a baby's chart. Lactation Consultation Note  Patient Name: Brittney Floyd Today's Date: 07/28/2017 Reason for consult: Initial assessment  Initial visit at 20 hours of life. Mom is a P3 who nursed her 1st child for 2 yrs & her 2nd child for 1.5 yrs. Mom reports that "Brittney Floyd" is nursing frequently & well. She reports hearing many swallows throughout the feedings.  Mom made aware of O/P services, breastfeeding support groups, community resources, and our phone # for post-discharge questions.   Viria served as Equities traderinterpreter during consult.    Lurline HareRichey, Alesha Jaffee [facility] 07/28/2017, 10:44 PM

## 2017-07-29 LAB — BIRTH TISSUE RECOVERY COLLECTION (PLACENTA DONATION)

## 2017-07-29 MED ORDER — IBUPROFEN 600 MG PO TABS: 600.0000 mg | ORAL_TABLET | Freq: Four times a day (QID) | ORAL | 0 refills | Status: AC

## 2017-07-29 NOTE — Discharge Instructions (Signed)
Parto vaginal, cuidados posteriores  Vaginal Delivery, Care After  Siga estas instrucciones durante las prximas semanas. Estas indicaciones le proporcionan informacin acerca de cmo deber cuidarse despus del parto vaginal. Su mdico tambin podr darle indicaciones ms especficas. El tratamiento ha sido planificado segn las prcticas mdicas actuales, pero en algunos casos pueden ocurrir problemas. Llame al mdico si tiene problemas o preguntas.  Qu puedo esperar despus del procedimiento?  Despus de un parto vaginal, es frecuente tener lo siguiente:   Hemorragia leve de la vagina.   Dolor en el abdomen, la vagina y la zona de la piel entre la abertura vaginal y el ano (perineo).   Calambres plvicos.   Fatiga.    Siga estas indicaciones en su casa:  Medicamentos   Tome los medicamentos de venta libre y los recetados solamente como se lo haya indicado el mdico.   Si le recetaron un antibitico, tmelo como se lo haya indicado el mdico. No interrumpa la administracin del antibitico hasta que lo haya terminado.  Conducir     No conduzca ni opere maquinaria pesada mientras toma analgsicos recetados.   No conduzca durante 24horas si le administraron un sedante.  Estilo de vida   No beba alcohol. Esto es de suma importancia si est amamantando o toma analgsicos.   No consuma productos que contengan tabaco, incluidos cigarrillos, tabaco de mascar o cigarrillos electrnicos. Si necesita ayuda para dejar de fumar, consulte al mdico.  Qu debe comer y beber   Beba al menos 8vasos de ochoonzas (240cc) de agua todos los das a menos que el mdico le indique lo contrario. Si elige amamantar al beb, quiz deba beber an ms cantidad de agua.   Coma alimentos ricos en fibras todos los das. Estos alimentos pueden ayudarla a prevenir o aliviar el estreimiento. Los alimentos ricos en fibras incluyen, entre otros:  ? Panes y cereales integrales.  ? Arroz integral.  ? Frijoles.  ? Frutas y verduras  frescas.  Actividad   Retome sus actividades normales como se lo haya indicado el mdico. Pregntele al mdico qu actividades son seguras para usted.   Descanse todo lo que pueda. Trate de descansar o tomar una siesta mientras el beb est durmiendo.   No levante objetos que pesen ms que su beb o 10libras (4,5kg) hasta que el mdico le diga que es seguro.   Hable con el mdico sobre cundo puede retomar la actividad sexual. Esto puede depender de lo siguiente:  ? Riesgo de sufrir una infeccin.  ? Velocidad de cicatrizacin.  ? Comodidad y deseo de retomar la actividad sexual.  Cuidados vaginales   Si le realizaron una episiotoma o tuvo un desgarro vaginal, contrlese la zona todos los das para detectar signos de infeccin. Est atenta a los siguientes signos:  ? Aumento del enrojecimiento, la hinchazn o el dolor.  ? Mayor presencia de lquido o sangre.  ? Calor.  ? Pus o mal olor.   No use tampones ni se haga duchas vaginales hasta que el mdico la autorice.   Controle la sangre que elimina por la vagina para detectar cogulos de sangre. Estos pueden tener el aspecto de grumos de color rojo oscuro, o secrecin marrn o negra.  Instrucciones generales   Mantenga el perineo limpio y seco, como se lo haya indicado el mdico.   Use ropa cmoda y suelta.   Cuando vaya al bao, siempre higiencese de adelante hacia atrs.   Pregntele al mdico si puede ducharse o tomar baos de inmersin.   Si se le realiz una episiotoma o tuvo un desgarro perineal durante el trabajo del parto o el parto, es posible que el mdico le indique que no tome baos de inmersin durante un determinado tiempo.   Use un sostn que sujete y ajuste bien sus pechos.   Si es posible, pdale a alguien que la ayude con las tareas del hogar y a cuidar del beb durante al menos algunos das despus de que le den el alta del hospital.   Concurra a todas las visitas de seguimiento para usted y el beb, como se lo haya indicado el  mdico. Esto es importante.  Comunquese con un mdico si:   Tiene los siguientes sntomas:  ? Secrecin vaginal que tiene mal olor.  ? Dificultad para orinar.  ? Dolor al orinar.  ? Aumento o disminucin repentinos de la frecuencia de las deposiciones.  ? Ms enrojecimiento, hinchazn o dolor alrededor de la episiotoma o del desgarro vaginal.  ? Ms secrecin de lquido o sangre de la episiotoma o del desgarro vaginal.  ? Pus o mal olor proveniente de la episiotoma o del desgarro vaginal.  ? Fiebre.  ? Erupcin cutnea.  ? Poco inters o falta de inters en actividades que solan gustarle.  ? Dudas sobre su cuidado y el del beb.   Siente la episiotoma o el desgarro vaginal caliente al tacto.   La episiotoma o el desgarro vaginal se abren o no parecen cicatrizar.   Siente dolor en las mamas, o estn duras o enrojecidas.   Siente tristeza o preocupacin de forma inusual.   Siente nuseas o vomita.   Elimina cogulos de sangre grandes por la vagina. Si expulsa un cogulo de sangre por la vagina, gurdelo para mostrrselo a su mdico. No tire la cadena sin que el mdico examine el cogulo de sangre antes.   Orina ms de lo habitual.   Se siente mareada o se desmaya.   No ha amamantado para nada y no ha tenido un perodo menstrual durante 12 semanas despus del parto.   Dej de amamantar al beb y no ha tenido su perodo menstrual durante 12 semanas despus de dejar de amamantar.  Solicite ayuda de inmediato si:   Tiene los siguientes sntomas:  ? Dolor que no desaparece o no mejora con medicamentos.  ? Dolor en el pecho.  ? Dificultad para respirar.  ? Visin borrosa o manchas en la vista.  ? Pensamientos de autolesionarse o lesionar al beb.   Comienza a sentir dolor en el abdomen o en una de las piernas.   Presenta un dolor de cabeza intenso.   Se desmaya.   Tiene una hemorragia de la vagina tan intensa que empapa dos toallitas sanitarias en una hora.  Esta informacin no tiene como fin  reemplazar el consejo del mdico. Asegrese de hacerle al mdico cualquier pregunta que tenga.  Document Released: 03/23/2005 Document Revised: 07/15/2016 Document Reviewed: 04/07/2015  Elsevier Interactive Patient Education  2018 [employer]

## 2017-07-29 NOTE — Discharge Summary (Addendum)
OB Discharge Summary     Patient Name: Brittney Floyd Mar-Mayorga DOB: August 01, 1980 MRN: [id number]  Date of admission: 07/27/2017 Delivering MD: Myrene BuddyFLETCHER, JACOB   Date of discharge: 07/29/2017  Admitting diagnosis: VERSION Intrauterine pregnancy: 5416w2d     Secondary diagnosis:  Active Problems:   Encounter for induction of labor   SVD (spontaneous vaginal delivery)  Additional problems: none     Discharge diagnosis: Term Pregnancy Delivered                                                                                                Post partum procedures:none  Augmentation: AROM, Pitocin and Foley Balloon  Complications: None  Hospital course:  Onset of Labor With Vaginal Delivery     37 y.o. yo [id number] at 10416w2d was admitted in Active Labor on 07/27/2017. Patient had an uncomplicated labor course as follows:  Membrane Rupture Time/Date: 1:40 AM ,07/28/2017   Intrapartum Procedures: Episiotomy: None [1]                                         Lacerations:  None [1]  Patient had a delivery of a Viable infant. 07/28/2017  Information for the patient's newborn:  Kenn FileMar-Mayorga, Boy Brittney Floyd [[id number]][[id number]]  Delivery Method: Vaginal, Spontaneous(Filed from Delivery Summary)    Pateint had an uncomplicated postpartum course.  She is ambulating, tolerating a regular diet, passing flatus, and urinating well. Patient is discharged home in stable condition on 07/29/17.   Physical exam  Vitals:   07/28/17 0440 07/28/17 0815 07/28/17 1639 07/29/17 0526  BP: 106/69 (!) 100/59 108/71 105/79  Pulse: 80 69 71 67  Resp: 16 17 17    Temp: 97.9 F (36.6 C) 98 F (36.7 C) 97.7 F (36.5 C)   TempSrc: Oral     SpO2:  100% 100%   Weight:      Height:       General: alert, cooperative and no distress Lochia: appropriate Uterine Fundus: firm Incision: N/A DVT Evaluation: No evidence of DVT seen on physical exam. Negative Homan's sign. Labs: Lab Results  Component Value Date   WBC 9.8  07/27/2017   HGB 11.4 (L) 07/27/2017   HCT 36.0 07/27/2017   MCV 69.5 (L) 07/27/2017   PLT 271 07/27/2017   No flowsheet data found.  Discharge instruction: per After Visit Summary and "Baby and Me Booklet".  After visit meds:  Allergies as of 07/29/2017      Reactions   Penicillins Rash   Has patient had a PCN reaction causing immediate rash, facial/tongue/throat swelling, SOB or lightheadedness with hypotension: Yes Has patient had a PCN reaction causing severe rash involving mucus membranes or skin necrosis: No Has patient had a PCN reaction that required hospitalization No Has patient had a PCN reaction occurring within the last 10 years: Yes If all of the above answers are "NO", then may proceed with Cephalosporin use.      Medication List    STOP taking these medications   prenatal  multivitamin Tabs tablet     TAKE these medications   ibuprofen 600 MG tablet Commonly known as:  ADVIL,MOTRIN Take 1 tablet (600 mg total) by mouth every 6 (six) hours.       Diet: routine diet  Activity: Advance as tolerated. Pelvic rest for 6 weeks.   Outpatient follow up:4 weeks Follow up Appt:No future appointments. Follow up Visit:No follow-ups on file.  Postpartum contraception: Condoms  Newborn Data: Live born female  Birth Weight: 6 lb 11.8 oz (3056 g) APGAR: 9, 9  Newborn Delivery   Birth date/time:  07/28/2017 01:50:00 Delivery type:  Vaginal, Spontaneous     Baby Feeding: Breast Disposition:home with mother   07/29/2017 Myrene Buddy, MD  I confirm that I have verified the information documented in the resident's note and that I have also personally reperformed the physical exam and all medical decision making activities.   Sharyon Cable, CNM 07/29/17, 12:21 PM

## 2017-09-17 NOTE — Telephone Encounter (Signed)
Preadmission screen  

## 2018-02-01 IMAGING — US US MFM OB DETAIL+14 WK
1 series · 14 of 28 positions shown · non-contrast
Comparison: none

[Series 1: us mfm ob detail+14 wk · 87 acquisitions, 14 frames shown]
[im 4/87]
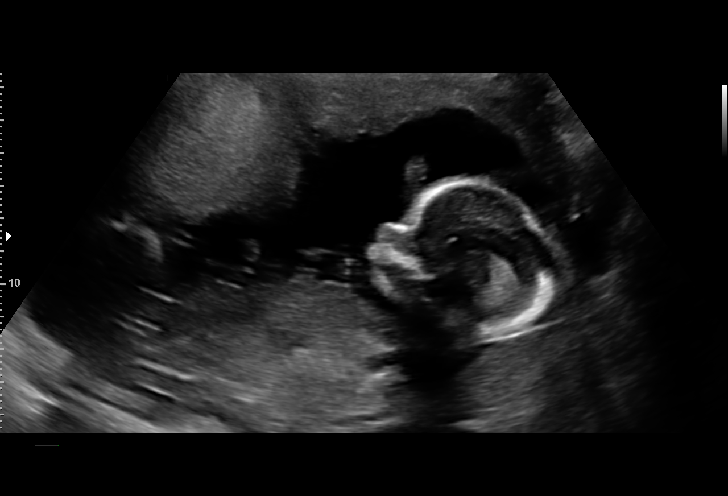
[im 10/87]
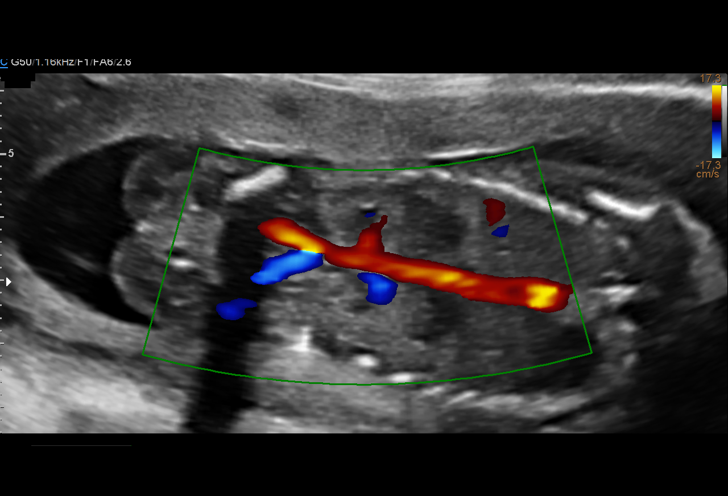
[im 16/87]
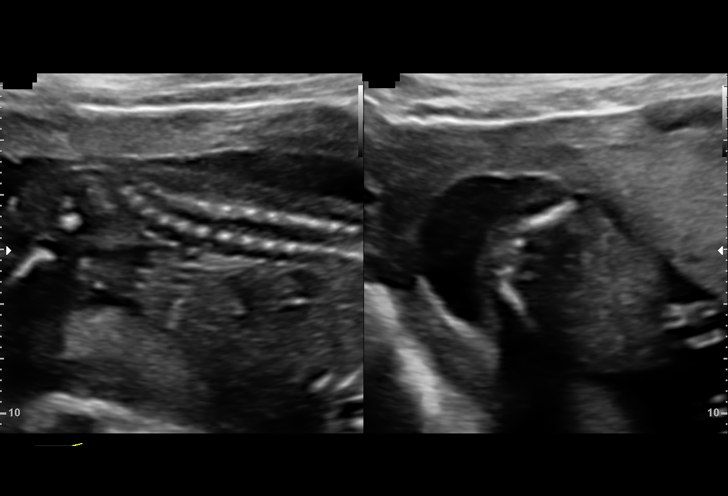
[im 23/87]
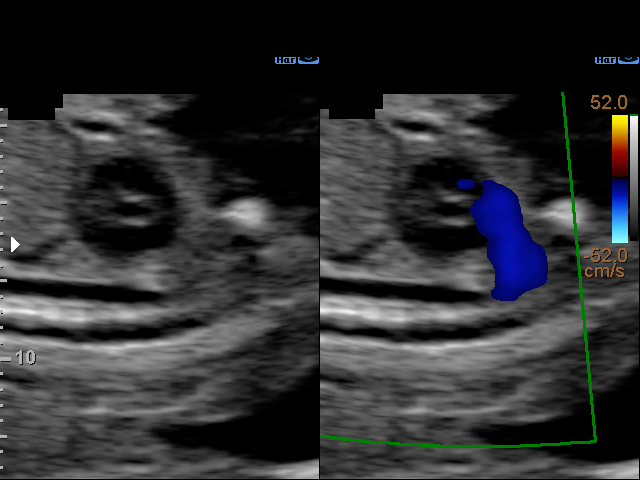
[im 29/87]
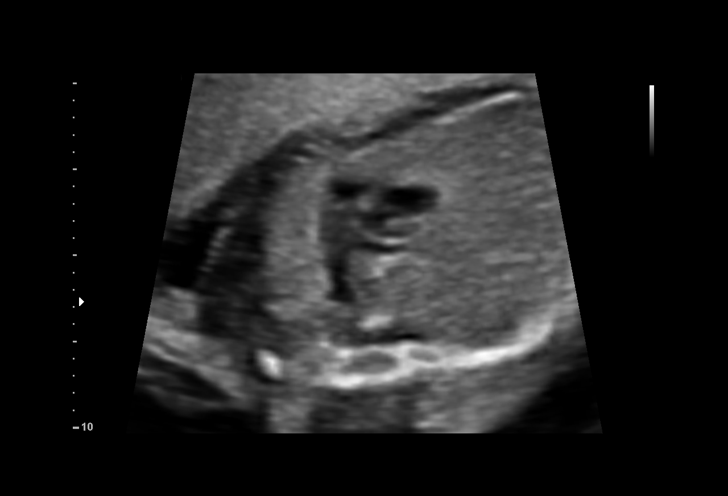
[im 36/87]
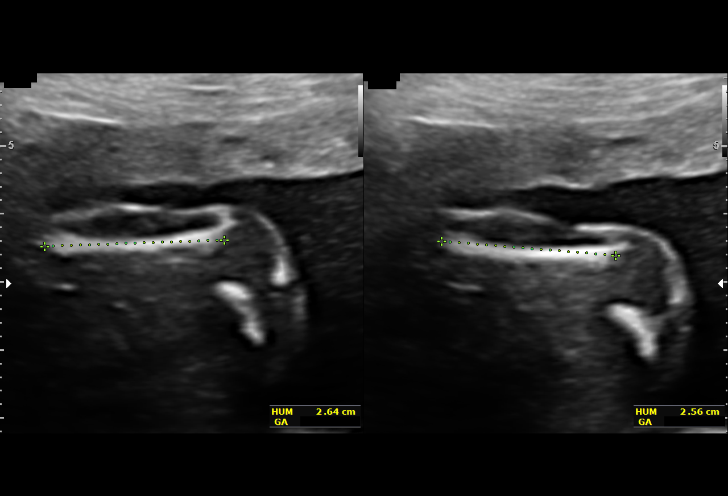
[im 42/87]
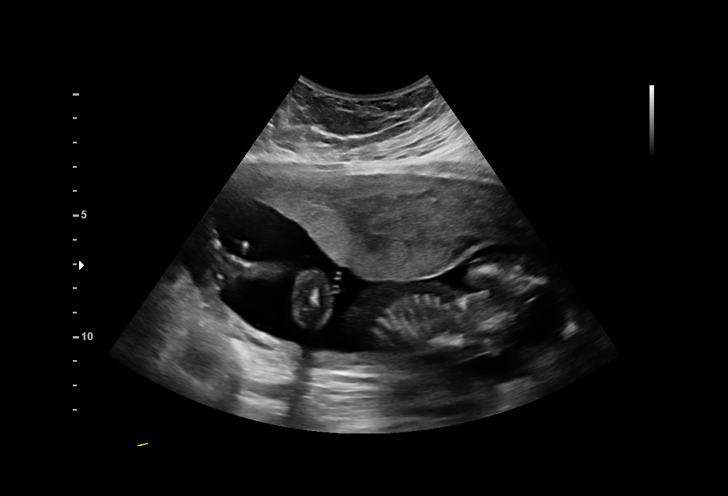
[im 48/87]
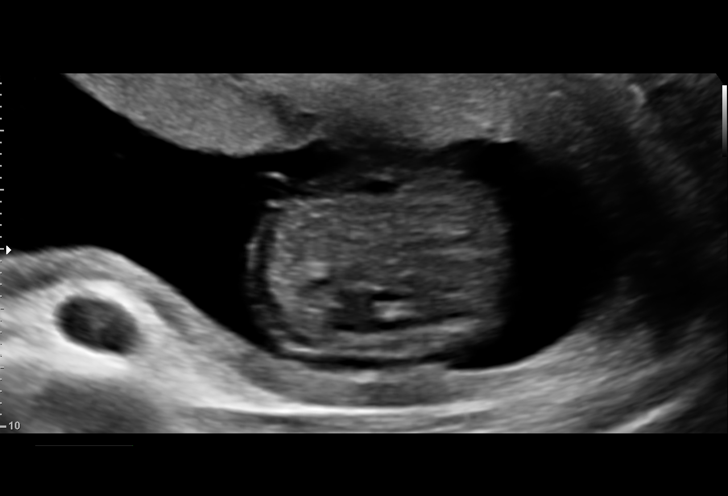
[im 55/87]
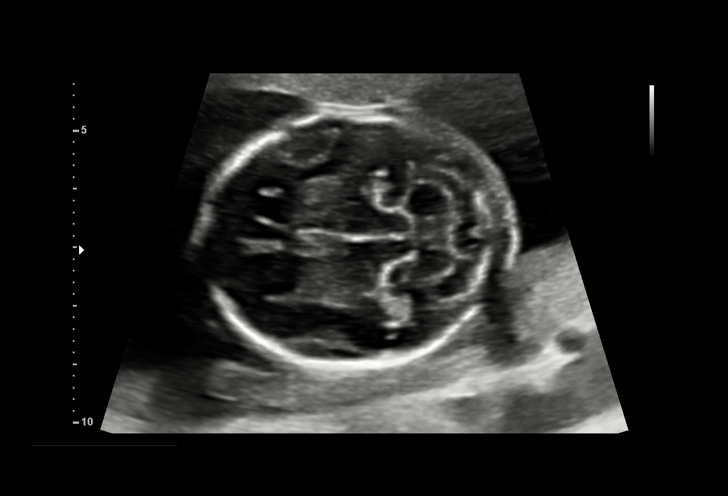
[im 61/87]
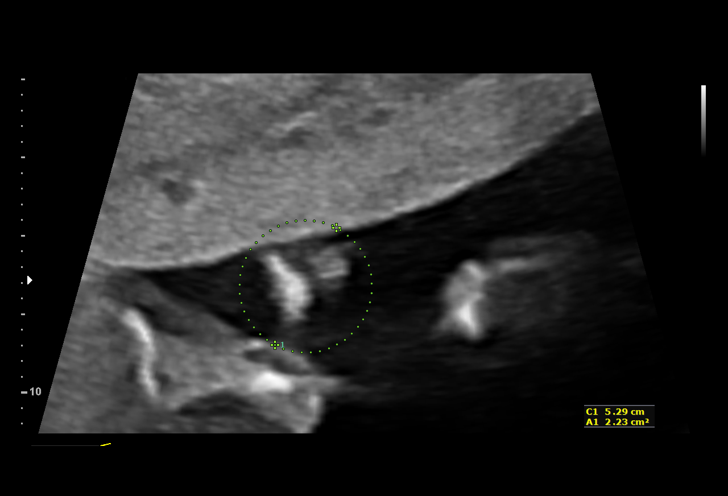
[im 67/87]
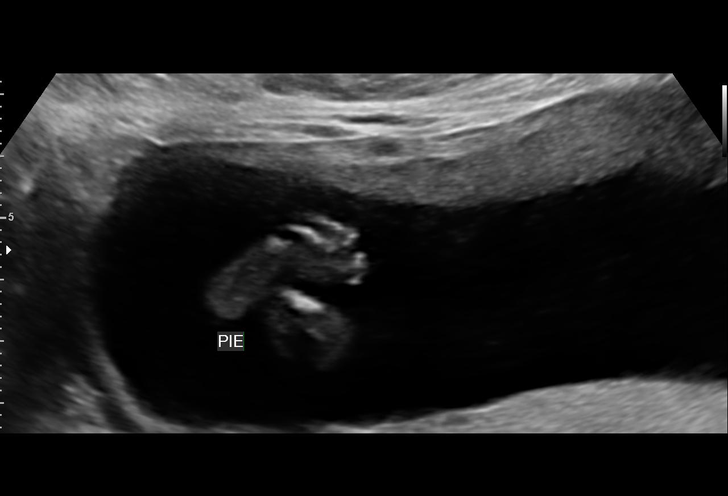
[im 74/87]
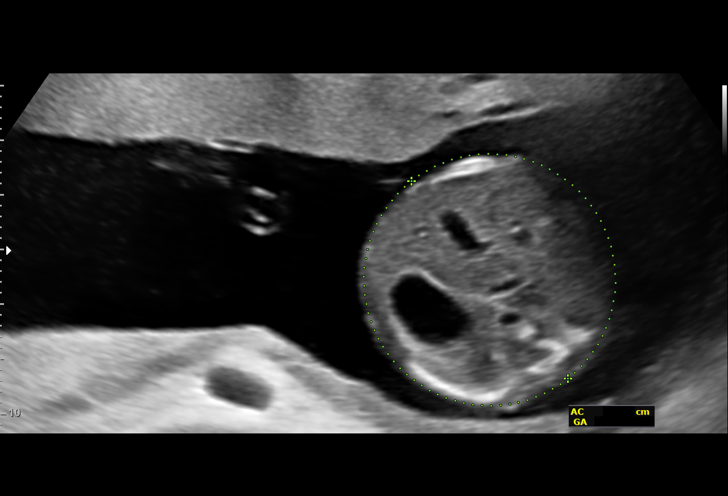
[im 80/87]
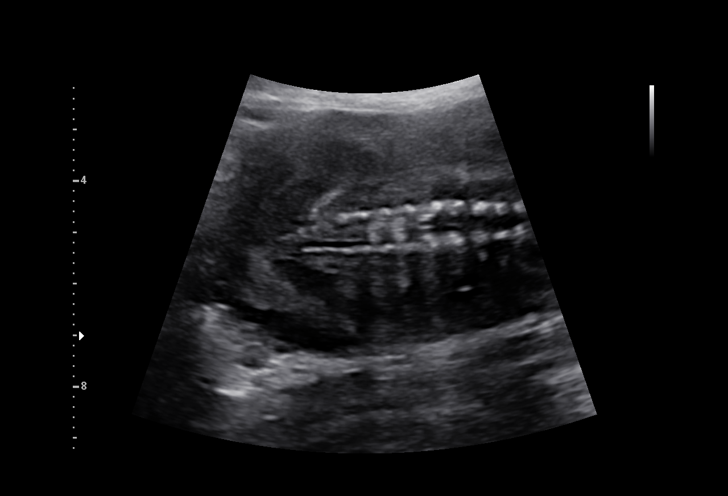
[im 87/87]
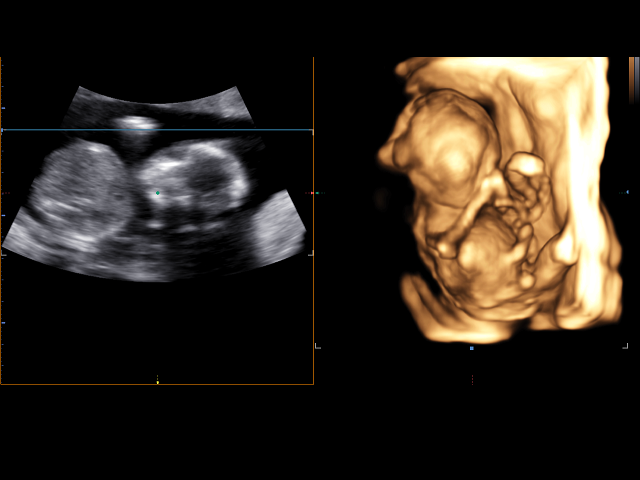

[14 of 28 positions shown; findings below may reference images not displayed]

[REDACTED] Health-
Faculty Physician

1  LIAH             [id number]      [phone]     [phone]
Indications

18 weeks gestation of pregnancy
Encounter for antenatal screening for
malformations
Advanced maternal age multigravida 35
second trimester -declined all testing
Family history of genetic disorder (nephew
w/ Digeorge syndrome)
OB History

Gravidity:    4         Term:   2        Prem:   0        SAB:   1
TOP:          0       Ectopic:  0        Living: 2
Fetal Evaluation

Num Of Fetuses:     1
Fetal Heart         141
Rate(bpm):
Cardiac Activity:   Observed
Presentation:       Cephalic
Placenta:           Anterior, above cervical os
P. Cord Insertion:  Visualized

Amniotic Fluid
AFI FV:      Subjectively within normal limits

Largest Pocket(cm)
4.71
Biometry

BPD:      43.3  mm     G. Age:  19w 1d         73  %    CI:        80.62   %    70 - 86
FL/HC:      17.3   %    16.1 -
HC:      152.3  mm     G. Age:  18w 2d         27  %    HC/AC:      1.06        1.09 -
AC:      143.1  mm     G. Age:  19w 4d         80  %    FL/BPD:     61.0   %
FL:       26.4  mm     G. Age:  18w 0d         25  %    FL/AC:      18.4   %    20 - 24
HUM:        26  mm     G. Age:  18w 1d         42  %
CER:      18.2  mm     G. Age:  18w 1d         34  %
NFT:       5.2  mm
CM:        4.9  mm

Est. FW:     260  gm      0 lb 9 oz     51  %
Gestational Age

LMP:           18w 4d        Date:  10/26/16                 EDD:   08/02/17
U/S Today:     18w 5d                                        EDD:   08/01/17
Best:          18w 4d     Det. By:  LMP  (10/26/16)          EDD:   08/02/17
Anatomy

Cranium:               Appears normal         Aortic Arch:            Appears normal
Cavum:                 Appears normal         Ductal Arch:            Appears normal
Ventricles:            Appears normal         Diaphragm:              Appears normal
Choroid Plexus:        Appears normal         Stomach:                Appears normal, left
sided
Cerebellum:            Appears normal         Abdomen:                Appears normal
Posterior Fossa:       Appears normal         Abdominal Wall:         Appears nml (cord
insert, abd wall)
Nuchal Fold:           Appears normal         Cord Vessels:           Appears normal (3
vessel cord)
Face:                  Appears normal         Kidneys:                Appear normal
(orbits and profile)
Lips:                  Appears normal         Bladder:                Appears normal
Thoracic:              Appears normal         Spine:                  Appears normal
Heart:                 Appears normal         Upper Extremities:      Appears normal
(4CH, axis, and situs
RVOT:                  Appears normal         Lower Extremities:      Appears normal
LVOT:                  Appears normal

Other:  Male gender. Heels visualized.
Cervix Uterus Adnexa

Cervix
Length:           3.96  cm.
Normal appearance by transabdominal scan.

Uterus
No abnormality visualized.

Left Ovary
Not visualized.

Right Ovary
Not visualized.

Cul De Sac:   No free fluid seen.
Impression

Singleton intrauterine pregnancy at 18 weeks 4 days
gestation with fetal cardiac activity
Cephalic presentation
Anterior placenta
Normal appearing fetal growth and amniotic fluid volume
No apparent birth defect noted on fetal anatomic survey
Normal appearing cervical length
Recommendations

Follow-up ultrasounds as clinically indicated.

## 2018-12-13 ENCOUNTER — Other Ambulatory Visit: Payer: Self-pay

## 2018-12-13 DIAGNOSIS — Z20822 Contact with and (suspected) exposure to covid-19: Secondary | ICD-10-CM

## 2018-12-15 LAB — NOVEL CORONAVIRUS, NAA: SARS-CoV-2, NAA: DETECTED — AB

## 2019-09-05 ENCOUNTER — Telehealth (INDEPENDENT_AMBULATORY_CARE_PROVIDER_SITE_OTHER): Payer: Self-pay | Admitting: Primary Care

## 2020-04-06 NOTE — L&D Delivery Note (Addendum)
LABOR COURSE Patient presented on 8/29 for a post-dates induction of labor. On arrival, cervical exam was 3.5/70/-2. She was started on pitocin. She progressed to complete at 2255.   Delivery Note Called to room and patient was complete and pushing. Head delivered LOA. A single loose nuchal cord was present and easily reduced. Shoulder and body delivered in usual fashion. At  2308 a healthy female was delivered via Vaginal, Spontaneous vertex presentation.  Infant with spontaneous cry, placed on mother's abdomen, dried and stimulated. Cord clamped x 2 after 1-minute delay, and cut by FOB. Cord blood drawn. Placenta delivered spontaneously with gentle cord traction. Appears intact. Fundus firm with massage and Pitocin. Labia, perineum, vagina, and cervix inspected with no lacerations.    APGAR: 9, 9; weight pending.   Cord: 3VC with the following complications: None   Anesthesia: None  Episiotomy: None Lacerations: None Est. Blood Loss (mL): 150  Mom to postpartum.  Baby to Couplet care / Skin to Skin.  Alicia Amel, MD 12/02/20 11:21 PM    GME ATTESTATION:  I was present and gloved for the duration of the delivery. I agree with the findings and the plan of care as documented in the resident's note.  Leticia Penna, DO OB Fellow, Faculty [facility], Center for [facility] 12/03/2020 5:24 AM

## 2020-11-25 ENCOUNTER — Other Ambulatory Visit (HOSPITAL_COMMUNITY): Payer: Self-pay | Admitting: Advanced Practice Midwife

## 2020-11-26 ENCOUNTER — Encounter (HOSPITAL_COMMUNITY): Payer: Self-pay | Admitting: *Deleted

## 2020-11-26 ENCOUNTER — Telehealth (HOSPITAL_COMMUNITY): Payer: Self-pay | Admitting: *Deleted

## 2020-11-26 ENCOUNTER — Encounter (HOSPITAL_COMMUNITY): Payer: Self-pay

## 2020-11-26 NOTE — Telephone Encounter (Signed)
Preadmission screen  

## 2020-11-29 ENCOUNTER — Other Ambulatory Visit: Payer: Self-pay | Admitting: Obstetrics & Gynecology

## 2020-11-30 LAB — SARS CORONAVIRUS 2 (TAT 6-24 HRS): SARS Coronavirus 2: NEGATIVE

## 2020-12-02 ENCOUNTER — Other Ambulatory Visit: Payer: Self-pay

## 2020-12-02 ENCOUNTER — Encounter (HOSPITAL_COMMUNITY): Payer: Self-pay | Admitting: Family Medicine

## 2020-12-02 ENCOUNTER — Inpatient Hospital Stay (HOSPITAL_COMMUNITY): Payer: Medicaid Other

## 2020-12-02 ENCOUNTER — Inpatient Hospital Stay (HOSPITAL_COMMUNITY)
Admission: AD | Admit: 2020-12-02 | Discharge: 2020-12-04 | DRG: 807 | Disposition: A | Discharge status: Home / Self Care | Payer: Medicaid Other | Attending: Family Medicine | Admitting: Family Medicine

## 2020-12-02 DIAGNOSIS — Z880 Allergy status to penicillin: Secondary | ICD-10-CM | POA: Diagnosis not present

## 2020-12-02 DIAGNOSIS — Z3A41 41 weeks gestation of pregnancy: Secondary | ICD-10-CM

## 2020-12-02 DIAGNOSIS — O480 Post-term pregnancy: Secondary | ICD-10-CM | POA: Diagnosis present

## 2020-12-02 LAB — CBC
HCT: 38.1 % (ref 36.0–46.0)
Hemoglobin: 12 g/dL (ref 12.0–15.0)
MCH: 23.6 pg — ABNORMAL LOW (ref 26.0–34.0)
MCHC: 31.5 g/dL (ref 30.0–36.0)
MCV: 75 fL — ABNORMAL LOW (ref 80.0–100.0)
Platelets: 240 10*3/uL (ref 150–400)
RBC: 5.08 MIL/uL (ref 3.87–5.11)
RDW: 21.6 % — ABNORMAL HIGH (ref 11.5–15.5)
WBC: 9.9 10*3/uL (ref 4.0–10.5)
nRBC: 0 % (ref 0.0–0.2)

## 2020-12-02 LAB — TYPE AND SCREEN
ABO/RH(D): O POS
Antibody Screen: NEGATIVE

## 2020-12-02 MED ORDER — OXYTOCIN-SODIUM CHLORIDE 30-0.9 UT/500ML-% IV SOLN
2.5000 [IU]/h | INTRAVENOUS | Status: DC
  Filled 2020-12-02 (×2): qty 500

## 2020-12-02 MED ORDER — SOD CITRATE-CITRIC ACID 500-334 MG/5ML PO SOLN: 30.0000 mL | ORAL | Status: DC | PRN

## 2020-12-02 MED ORDER — ONDANSETRON HCL 4 MG/2ML IJ SOLN: 4.0000 mg | Freq: Four times a day (QID) | INTRAMUSCULAR | Status: DC | PRN

## 2020-12-02 MED ORDER — LACTATED RINGERS IV SOLN: 500.0000 mL | INTRAVENOUS | Status: DC | PRN

## 2020-12-02 MED ORDER — LACTATED RINGERS IV SOLN: INTRAVENOUS | Status: DC

## 2020-12-02 MED ORDER — TERBUTALINE SULFATE 1 MG/ML IJ SOLN: 0.2500 mg | Freq: Once | INTRAMUSCULAR | Status: DC | PRN

## 2020-12-02 MED ORDER — OXYTOCIN-SODIUM CHLORIDE 30-0.9 UT/500ML-% IV SOLN
1.0000 m[IU]/min | INTRAVENOUS | Status: DC
  Administered 2020-12-02: 2 m[IU]/min via INTRAVENOUS

## 2020-12-02 MED ORDER — LIDOCAINE HCL (PF) 1 % IJ SOLN: 30.0000 mL | INTRAMUSCULAR | Status: DC | PRN

## 2020-12-02 MED ORDER — FLEET ENEMA 7-19 GM/118ML RE ENEM: 1.0000 | ENEMA | RECTAL | Status: DC | PRN

## 2020-12-02 MED ORDER — MISOPROSTOL 25 MCG QUARTER TABLET
25.0000 ug | ORAL_TABLET | ORAL | Status: DC | PRN
Start: 2020-12-02 — End: 2020-12-03

## 2020-12-02 MED ORDER — ACETAMINOPHEN 325 MG PO TABS: 650.0000 mg | ORAL_TABLET | ORAL | Status: DC | PRN

## 2020-12-02 MED ORDER — ZOLPIDEM TARTRATE 5 MG PO TABS: 5.0000 mg | ORAL_TABLET | Freq: Every evening | ORAL | Status: DC | PRN

## 2020-12-02 MED ORDER — HYDROXYZINE HCL 50 MG PO TABS: 50.0000 mg | ORAL_TABLET | Freq: Four times a day (QID) | ORAL | Status: DC | PRN

## 2020-12-02 MED ORDER — FENTANYL CITRATE (PF) 100 MCG/2ML IJ SOLN: 50.0000 ug | INTRAMUSCULAR | Status: DC | PRN

## 2020-12-02 MED ORDER — OXYTOCIN BOLUS FROM INFUSION
333.0000 mL | Freq: Once | INTRAVENOUS | Status: AC
  Administered 2020-12-02: 333 mL via INTRAVENOUS

## 2020-12-02 MED ORDER — OXYCODONE-ACETAMINOPHEN 5-325 MG PO TABS
2.0000 | ORAL_TABLET | ORAL | Status: DC | PRN
Start: 2020-12-02 — End: 2020-12-03

## 2020-12-02 MED ORDER — OXYCODONE-ACETAMINOPHEN 5-325 MG PO TABS: 1.0000 | ORAL_TABLET | ORAL | Status: DC | PRN

## 2020-12-02 NOTE — H&P (Signed)
OBSTETRIC ADMISSION HISTORY AND PHYSICAL  Brittney Floyd is a 40 y.o. female [phone] with IUP at [redacted]w[redacted]d by LMP presenting for IOL for postdates. She reports +FMs, no LOF, no VB, no blurry vision, headaches, peripheral edema, or RUQ pain.  She plans on breast feeding. Her partner has had a vasectomy so she does not request any other forms of contraception at this time.   She received her prenatal care at [facility].   Dating: By LMP --->  Estimated Date of Delivery: 11/24/20  Sono:   @[redacted]w[redacted]d , CWD, normal anatomy, anterior placental lie, 49.5% EFW  Prenatal History/Complications:  AMA  Past Medical History: Past Medical History:  Diagnosis Date   Anemia    Chlamydia    Cholestasis during pregnancy     Past Surgical History: Past Surgical History:  Procedure Laterality Date   COLPOSCOPY      Obstetrical History: OB History     Gravida  5   Para  3   Term  3   Preterm      AB  1   Living  3      SAB  1   IAB      Ectopic      Multiple  0   Live Births  3           Social History Social History   Socioeconomic History   Marital status: Married    Spouse name: Not on file   Number of children: Not on file   Years of education: Not on file   Highest education level: Not on file  Occupational History   Not on file  Tobacco Use   Smoking status: Never   Smokeless tobacco: Never  Vaping Use   Vaping Use: Never used  Substance and Sexual Activity   Alcohol use: No   Drug use: No   Sexual activity: Yes    Birth control/protection: None  Other Topics Concern   Not on file  Social History Narrative   Not on file   Social Determinants of Health   Financial Resource Strain: Not on file  Food Insecurity: Not on file  Transportation Needs: Not on file  Physical Activity: Not on file  Stress: Not on file  Social Connections: Not on file    Family History: Family History  Problem Relation Age of Onset   Heart disease Father    Hepatitis  Father    Asthma Paternal Grandmother     Allergies: Allergies  Allergen Reactions   Penicillins Rash    Has patient had a PCN reaction causing immediate rash, facial/tongue/throat swelling, SOB or lightheadedness with hypotension: Yes Has patient had a PCN reaction causing severe rash involving mucus membranes or skin necrosis: No Has patient had a PCN reaction that required hospitalization No Has patient had a PCN reaction occurring within the last 10 years: Yes If all of the above answers are "NO", then may proceed with Cephalosporin use.     Medications Prior to Admission  Medication Sig Dispense Refill Last Dose   ibuprofen (ADVIL,MOTRIN) 600 MG tablet Take 1 tablet (600 mg total) by mouth every 6 (six) hours. 30 tablet 0      Review of Systems  All systems reviewed and negative except as stated in HPI  Blood pressure 104/61, pulse 69, temperature 98.2 F (36.8 C), resp. rate 17, height 5\' 1"  (1.549 m), weight 78 kg, unknown if currently breastfeeding.  General appearance: alert, cooperative, and no distress Lungs: normal work  of breathing on room air  Heart: normal rate, warm and well-perfused  Abdomen: soft, non-tender, gravid Extremities: no LE edema or calf tenderness to palpation   Presentation: cephalic  Fetal monitoring: 145 bpm, moderate variability, + accels, - decels Uterine activity: Every 5 minutes   Dilation: 3.5 Effacement (%): 90 Station: -2 Exam by:: Dr Leary Roca  Prenatal labs: ABO, Rh: --/--/O POS (08/29 1437)O POS (06/17/20) Antibody: NEG (08/29 1437)Negative (06/17/20) Rubella:  Immune (06/17/20) RPR:  Nonreactive (09/09/20)  HBsAg:  Nonreactive (06/17/20)  HIV:  Nonreactive (09/09/20)  GBS:  Negative (10/28/20)  1 hr Glucola - 96 Genetic screening - Too late; declined  Anatomy US - reportedly unremarkable per [facility] records; unable to view actual report  Prenatal Transfer Tool  Maternal Diabetes: No Genetic Screening: Declined/Too Late Maternal  Ultrasounds/Referrals: Normal Fetal Ultrasounds or other Referrals:  None Maternal Substance Abuse:  No Significant Maternal Medications:  None Significant Maternal Lab Results: Group B Strep negative  Results for orders placed or performed during the hospital encounter of 12/02/20 (from the past 24 hour(s))  CBC   Collection Time: 12/02/20  2:37 PM  Result Value Ref Range   WBC 9.9 4.0 - 10.5 K/uL   RBC 5.08 3.87 - 5.11 MIL/uL   Hemoglobin 12.0 12.0 - 15.0 g/dL   HCT 94.1 74.0 - 81.4 %   MCV 75.0 (L) 80.0 - 100.0 fL   MCH 23.6 (L) 26.0 - 34.0 pg   MCHC 31.5 30.0 - 36.0 g/dL   RDW 48.1 (H) 85.6 - 31.4 %   Platelets 240 150 - 400 K/uL   nRBC 0.0 0.0 - 0.2 %  Type and screen   Collection Time: 12/02/20  2:37 PM  Result Value Ref Range   ABO/RH(D) O POS    Antibody Screen NEG    Sample Expiration      12/05/2020,2359 Performed at [facility], [address] [place], [place] [address]     Patient Active Problem List   Diagnosis Date Noted   Indication for care in labor and delivery, antepartum 12/02/2020   Low grade squamous intraepithelial lesion (LGSIL) on cervical Pap smear 09/19/2013    Assessment/Plan:  Brittney Floyd is a 40 y.o. [id number] at [redacted]w[redacted]d here for IOL for postdates.   #Labor: SVE 3.5/90/-2. Unable to AROM this check due to fetal head not yet being well-applied. Will start Pitocin 2x2. Will reassess in 3-4 hours and consider AROM at next check.  #Pain: PRN; does not want epidural  #FWB: Cat 1  #ID:  GBS neg #MOF: Breast #MOC: Partner vasectomy #Circ:  No  Worthy Rancher, MD  OB Fellow  Faculty Practice 12/02/2020, 4:13 PM

## 2020-12-02 NOTE — Progress Notes (Signed)
Labor Progress Note Brittney Floyd is a 40 y.o. [phone] at [redacted]w[redacted]d presented for IOL for postdates.   S: Doing well, feeling alittle more pressure and would like to be checked.   O:  BP 111/62   Pulse 76   Temp 98.3 F (36.8 C) (Oral)   Resp 17   Ht 5\' 1"  (1.549 m)   Wt 78 kg   BMI 32.50 kg/m  EFM: 150/mod/15x15  CVE: Dilation: 6 Effacement (%): 90 Cervical Position: Posterior Station: 0 Presentation: Vertex Exam by:: Dr [email or web address]   A&P: 40 y.o. 24 [redacted]w[redacted]d. #Labor: Progressing well with pit. After verbal consent, Dr. [redacted]w[redacted]d performed AROM with scant clear fluid retrieved. Patient tolerated procedure well. Head well applied before and after.   #Pain: PRN, does not desire epidural  #FWB: Cat 1  #GBS negative    [name], DO 9:55 PM

## 2020-12-03 ENCOUNTER — Encounter (HOSPITAL_COMMUNITY): Payer: Self-pay | Admitting: Family Medicine

## 2020-12-03 LAB — CBC
HCT: 36.5 % (ref 36.0–46.0)
Hemoglobin: 11.7 g/dL — ABNORMAL LOW (ref 12.0–15.0)
MCH: 23.7 pg — ABNORMAL LOW (ref 26.0–34.0)
MCHC: 32.1 g/dL (ref 30.0–36.0)
MCV: 73.9 fL — ABNORMAL LOW (ref 80.0–100.0)
Platelets: 228 10*3/uL (ref 150–400)
RBC: 4.94 MIL/uL (ref 3.87–5.11)
RDW: 21.5 % — ABNORMAL HIGH (ref 11.5–15.5)
WBC: 16.1 10*3/uL — ABNORMAL HIGH (ref 4.0–10.5)
nRBC: 0 % (ref 0.0–0.2)

## 2020-12-03 LAB — RPR: RPR Ser Ql: NONREACTIVE

## 2020-12-03 MED ORDER — SENNOSIDES-DOCUSATE SODIUM 8.6-50 MG PO TABS
2.0000 | ORAL_TABLET | Freq: Every day | ORAL | Status: DC
  Administered 2020-12-03: 2 via ORAL
  Filled 2020-12-03: qty 2

## 2020-12-03 MED ORDER — PRENATAL MULTIVITAMIN CH
1.0000 | ORAL_TABLET | Freq: Every day | ORAL | Status: DC
  Administered 2020-12-03: 1 via ORAL
  Filled 2020-12-03: qty 1

## 2020-12-03 MED ORDER — ZOLPIDEM TARTRATE 5 MG PO TABS: 5.0000 mg | ORAL_TABLET | Freq: Every evening | ORAL | Status: DC | PRN

## 2020-12-03 MED ORDER — WITCH HAZEL-GLYCERIN EX PADS: 1.0000 "application " | MEDICATED_PAD | CUTANEOUS | Status: DC | PRN

## 2020-12-03 MED ORDER — TETANUS-DIPHTH-ACELL PERTUSSIS 5-2.5-18.5 LF-MCG/0.5 IM SUSY: 0.5000 mL | PREFILLED_SYRINGE | Freq: Once | INTRAMUSCULAR | Status: DC

## 2020-12-03 MED ORDER — SIMETHICONE 80 MG PO CHEW: 80.0000 mg | CHEWABLE_TABLET | ORAL | Status: DC | PRN

## 2020-12-03 MED ORDER — DIBUCAINE (PERIANAL) 1 % EX OINT: 1.0000 "application " | TOPICAL_OINTMENT | CUTANEOUS | Status: DC | PRN

## 2020-12-03 MED ORDER — IBUPROFEN 600 MG PO TABS
600.0000 mg | ORAL_TABLET | Freq: Four times a day (QID) | ORAL | Status: DC
  Administered 2020-12-03 – 2020-12-04 (×5): 600 mg via ORAL
  Filled 2020-12-03 (×5): qty 1

## 2020-12-03 MED ORDER — DIPHENHYDRAMINE HCL 25 MG PO CAPS: 25.0000 mg | ORAL_CAPSULE | Freq: Four times a day (QID) | ORAL | Status: DC | PRN

## 2020-12-03 MED ORDER — ACETAMINOPHEN 325 MG PO TABS
650.0000 mg | ORAL_TABLET | ORAL | Status: DC | PRN
  Filled 2020-12-03: qty 2

## 2020-12-03 MED ORDER — ONDANSETRON HCL 4 MG PO TABS: 4.0000 mg | ORAL_TABLET | ORAL | Status: DC | PRN

## 2020-12-03 MED ORDER — ONDANSETRON HCL 4 MG/2ML IJ SOLN: 4.0000 mg | INTRAMUSCULAR | Status: DC | PRN

## 2020-12-03 MED ORDER — BENZOCAINE-MENTHOL 20-0.5 % EX AERO
1.0000 "application " | INHALATION_SPRAY | CUTANEOUS | Status: DC | PRN
  Administered 2020-12-03: 1 via TOPICAL
  Filled 2020-12-03: qty 56

## 2020-12-03 MED ORDER — COCONUT OIL OIL
1.0000 "application " | TOPICAL_OIL | Status: DC | PRN
  Administered 2020-12-03: 1 via TOPICAL

## 2020-12-03 NOTE — Progress Notes (Signed)
Post Partum Day 1 Subjective: no complaints, up ad lib, voiding, and tolerating PO No flatus or BM. Otherwise feeling well.   Objective: Blood pressure 98/70, pulse 69, temperature 98.1 F (36.7 C), temperature source Oral, resp. rate 18, height 5\' 1"  (1.549 m), weight 78 kg, SpO2 100 %, unknown if currently breastfeeding.  Physical Exam:  General: alert, cooperative, and no distress Lochia: appropriate Uterine Fundus: firm DVT Evaluation: No evidence of DVT seen on physical exam.  Recent Labs    12/02/20 1437 12/03/20 0431  HGB 12.0 11.7*  HCT 38.1 36.5    Assessment/Plan: Plan for discharge tomorrow and Contraception husband has already had vasectomy   LOS: 1 day   12/05/20 12/03/2020, 8:02 AM

## 2020-12-03 NOTE — Lactation Note (Signed)
This note was copied from a baby's chart. Lactation Consultation Note  Patient Name: Brittney Floyd [id number] Date: 12/03/2020 Reason for consult: Initial assessment;Term Age:41 hours  LC in to visit with P4 Mom of term baby.  Mom states she feels that baby is latching well.  Baby just fed for 15 mins and is asleep in Mom's arms. Baby has latched and fed 6 times so far.  Encouraged STS as much as possible.  Mom to offer breast with cues.   Mom denies any questions or need for assistance currently.  Lactation brochure provided and Mom understands IP and OP lactation support available to her.  Encouraged to call for help prn.   Interventions Interventions: Breast feeding basics reviewed;Skin to skin;Breast massage;Hand express   Consult Status Consult Status: Follow-up Date: 12/04/20 Follow-up type: In-patient    [name] 12/03/2020, 7:51 AM

## 2020-12-03 NOTE — Discharge Summary (Addendum)
Postpartum Discharge Summary     Patient Name: Brittney Floyd [facility] DOB: 27-Feb-1981 MRN: [id number]  Date of admission: 12/02/2020 Delivery date:12/02/2020  Delivering provider: Pearla Dubonnet  Date of discharge: 12/04/2020  Admitting diagnosis: Indication for care in labor and delivery, antepartum [O75.9] Intrauterine pregnancy: [redacted]w[redacted]d    Secondary diagnosis:  Active Problems:   Indication for care in labor and delivery, antepartum  Additional problems: None   Discharge diagnosis: Term Pregnancy Delivered                                              Post partum procedures: None Augmentation: AROM and Pitocin Complications: None  Hospital course: Induction of Labor With Vaginal Delivery   40y.o. yo [id number] 474w1das admitted to the hospital 12/02/2020 for induction of labor.  Indication for induction: Postdates.  Patient had an uncomplicated labor course as follows: Membrane Rupture Time/Date: 9:10 PM ,12/02/2020   Delivery Method:Vaginal, Spontaneous  Episiotomy: None  Lacerations:  None  Details of delivery can be found in separate delivery note.  Patient had a routine postpartum course. Patient is discharged home 12/04/20.  Newborn Data: Birth date:12/02/2020  Birth time:11:08 PM  Gender:Female  Living status:Living  Apgars:9 ,9  Weight:3311 g   Magnesium Sulfate received: No BMZ received: No Rhophylac:No MMR:No T-DaP:Given prenatally Flu: N/A Transfusion:No  Physical exam  Vitals:   12/03/20 0950 12/03/20 1432 12/03/20 2130 12/04/20 0540  BP: 103/69 105/64 106/70 108/68  Pulse: 65 62 61 60  Resp: _0 Temp: 98.3 F (36.8 C) 97.6 F (36.4 C) 97.7 F (36.5 C) 97.8 F (36.6 C)  TempSrc: Oral Oral Oral Oral  SpO2: 100% 100% 100% 100%  Weight:      Height:       General: alert, cooperative, and no distress Lochia: appropriate Uterine Fundus: firm DVT Evaluation: No evidence of DVT seen on physical exam. Labs: Lab Results  Component Value  Date   WBC 16.1 (H) 12/03/2020   HGB 11.7 (L) 12/03/2020   HCT 36.5 12/03/2020   MCV 73.9 (L) 12/03/2020   PLT 228 12/03/2020   No flowsheet data found. Edinburgh Score: Edinburgh Postnatal Depression Scale Screening Tool 12/04/2020  I have been able to laugh and see the funny side of things. 0  I have looked forward with enjoyment to things. 0  I have blamed myself unnecessarily when things went wrong. 0  I have been anxious or worried for no good reason. 0  I have felt scared or panicky for no good reason. 0  Things have been getting on top of me. 0  I have been so unhappy that I have had difficulty sleeping. 0  I have felt sad or miserable. 0  I have been so unhappy that I have been crying. 0  The thought of harming myself has occurred to me. 0  Edinburgh Postnatal Depression Scale Total 0     After visit meds:  Allergies as of 12/04/2020       Reactions   Penicillins Rash   Has patient had a PCN reaction causing immediate rash, facial/tongue/throat swelling, SOB or lightheadedness with hypotension: Yes Has patient had a PCN reaction causing severe rash involving mucus membranes or skin necrosis: No Has patient had a PCN reaction that required hospitalization No Has patient had a PCN reaction occurring within the  last 10 years: Yes If all of the above answers are "NO", then may proceed with Cephalosporin use.        Medication List     TAKE these medications    acetaminophen 325 MG tablet Commonly known as: Tylenol Take 2 tablets (650 mg total) by mouth every 4 (four) hours as needed (for pain scale < 4).   ibuprofen 600 MG tablet Commonly known as: ADVIL Take 1 tablet (600 mg total) by mouth every 6 (six) hours.         Discharge home in stable condition Infant Feeding: Breast Infant Disposition:home with mother Discharge instruction: per After Visit Summary and Postpartum booklet. Activity: Advance as tolerated. Pelvic rest for 6 weeks.  Diet: routine  diet Future Appointments:No future appointments. Follow up Visit:  Received care at [facility]. Discussed with patient to call and schedule follow up appointment - patient expressed understanding.   Delivery mode:  Vaginal, Spontaneous  Anticipated Birth Control:   Vasectomy , done  12/04/2020 Pearla Dubonnet, MD  GME ATTESTATION:  I saw and evaluated the patient. I agree with the findings and the plan of care as documented in the resident's note.  Renard Matter, MD, MPH OB Fellow, Faculty Practice

## 2020-12-03 NOTE — Lactation Note (Signed)
This note was copied from a baby's chart. Lactation Consultation Note  Patient Name: Brittney Floyd [id number] Date: 12/03/2020 Reason for consult: Follow-up assessment Age:40 hours  Consult was done in Spanish: LC in to room for follow up. Mother states breastfeeding is going well but there is a little bit of discomfort with latch. LC noted bruise on left nipple. Reviewed breast compression and asymmetrical latch as well as encouraging mother to lay back.  Observed flanged lips, good suckling and swallowing. Mother denies pain or discomfort with latch.  Discussed clusterfeeding and normal newborn behavior after 24h.  Plan:   1-Breastfeeding on demand, ensuring a deep, comfortable latch.  2-Undressing infant and place skin to skin when ready to breastfeed 3-Keep infant awake during breastfeeding session: massaging breast, infant's hand/shoulder/feet 4-Monitor voids and stools as signs good intake.  5-Contact LC as needed for feeds/support/concerns/questions   Feeding Mother's Current Feeding Choice: Breast Milk  LATCH Score Latch: Grasps breast easily, tongue down, lips flanged, rhythmical sucking.  Audible Swallowing: Spontaneous and intermittent  Type of Nipple: Everted at rest and after stimulation  Comfort (Breast/Nipple): Soft / non-tender  Hold (Positioning): Assistance needed to correctly position infant at breast and maintain latch.  LATCH Score: 9  Interventions Interventions: Breast feeding basics reviewed;Assisted with latch;Skin to skin;Breast massage;Hand express;Adjust position;Breast compression;Coconut oil;Expressed milk;Support pillows;Education  Consult Status Consult Status: Follow-up Date: 12/04/20 Follow-up type: In-patient    Brittney Floyd 12/03/2020, 9:46 PM

## 2020-12-04 MED ORDER — ACETAMINOPHEN 325 MG PO TABS: 650.0000 mg | ORAL_TABLET | ORAL | Status: AC | PRN

## 2020-12-14 ENCOUNTER — Telehealth (HOSPITAL_COMMUNITY): Payer: Self-pay

## 2020-12-14 NOTE — Telephone Encounter (Signed)
"  We are fine thank god. Feeling very well. Happy to be home." Patient declines questions or concerns about her healing.  "He is doing very good. Eating and sleeping. He is eating more. I'm giving some formula because I wasn't producing much from my breast. I'm still breastfeeding, i'm just giving him some formula after breastfeeding if he is still hungry. He sleeps in a crib."  RN reviewed ABC's of safe sleep with patient. Patient declines any questions or concerns about baby.  EPDS score is 0.  Marcelino Duster [facility] 12/14/2020,1629

## 2021-12-29 ENCOUNTER — Other Ambulatory Visit: Payer: Self-pay | Admitting: Obstetrics & Gynecology

## 2021-12-29 DIAGNOSIS — Z1231 Encounter for screening mammogram for malignant neoplasm of breast: Secondary | ICD-10-CM

## 2022-01-13 ENCOUNTER — Ambulatory Visit
Admission: RE | Admit: 2022-01-13 | Discharge: 2022-01-13 | Disposition: A | Discharge status: Home / Self Care | Payer: No Typology Code available for payment source | Source: Ambulatory Visit | Attending: Obstetrics & Gynecology | Admitting: Obstetrics & Gynecology

## 2022-01-13 DIAGNOSIS — Z1231 Encounter for screening mammogram for malignant neoplasm of breast: Secondary | ICD-10-CM

## 2022-01-15 ENCOUNTER — Other Ambulatory Visit: Payer: Self-pay | Admitting: Obstetrics & Gynecology

## 2022-01-15 DIAGNOSIS — R928 Other abnormal and inconclusive findings on diagnostic imaging of breast: Secondary | ICD-10-CM

## 2022-02-03 ENCOUNTER — Ambulatory Visit: Payer: Self-pay | Admitting: *Deleted

## 2022-02-03 VITALS — BP 126/82 | Wt 145.5 lb

## 2022-02-03 DIAGNOSIS — Z1239 Encounter for other screening for malignant neoplasm of breast: Secondary | ICD-10-CM

## 2022-02-03 NOTE — Progress Notes (Addendum)
[name] is a 41 y.o. female who presents to [facility] clinic today with no complaints. Patient had a screening mammogram completed 01/13/2022 that additional imaging of the left breast is recommended for follow up due to possible asymmetry.   Pap Smear: Pap smear not completed today. Last Pap smear was 12/09/2020 at the [facility] clinic and was normal with negative HPV per patient Brittney Fender, RN at the [facility]. Per patient has history of an abnormal Pap smear 08/02/2013 that a colposcopy was completed for follow up 10/04/2013 that was benign. Per patient has a history of one other abnormal Pap smear in 2012 that per patient had a repeat Pap smear for follow up. Patient stated that she has had at least three normal Pap smears since since colposcopy 10/04/2013. Last Pap smear result is not available in Epic.   Physical exam: Breasts Breasts symmetrical. No skin abnormalities bilateral breasts. No nipple retraction bilateral breasts. Patient is currently breastfeeding her 100 month old son. No lymphadenopathy. No lumps palpated bilateral breasts. No complaints of pain or tenderness on exam.     MS DIGITAL SCREENING TOMO BILATERAL  Result Date: 01/14/2022 CLINICAL DATA:  Screening. EXAM: DIGITAL SCREENING BILATERAL MAMMOGRAM WITH TOMOSYNTHESIS AND CAD TECHNIQUE: Bilateral screening digital craniocaudal and mediolateral oblique mammograms were obtained. Bilateral screening digital breast tomosynthesis was performed. The images were evaluated with computer-aided detection. COMPARISON:  None. ACR Breast Density Category c: The breast tissue is heterogeneously dense, which may obscure small masses. FINDINGS: In the left breast, a possible asymmetry warrants further evaluation. In the right breast, no findings suspicious for malignancy. IMPRESSION: Further evaluation is suggested for possible asymmetry in the left breast. RECOMMENDATION: Diagnostic mammogram  and possibly ultrasound of the left breast. (Code:[id number]) The patient will be contacted regarding the findings, and additional imaging will be scheduled. BI-RADS CATEGORY  0: Incomplete. Need additional imaging evaluation and/or prior mammograms for comparison. Electronically Signed   By: Marin Olp M.D.   On: 01/14/2022 11:31     Pelvic/Bimanual Pap is not indicated today per BCCCP guidelines.   Smoking History: Patient has never smoked.   Patient Navigation: Patient education provided. Access to services provided for patient through [place] program. Spanish interpreter Brittney Floyd from [facility] provided.    Breast and Cervical Cancer Risk Assessment: Patient does not have family history of breast cancer, known genetic mutations, or radiation treatment to the chest before age 90. Patient has history of cervical dysplasia. Patient has no history of being immunocompromised or DES exposure in-utero.  Risk Assessment     Risk Scores       02/03/2022   Last edited by: Royston Bake, CMA   5-year risk: 0.4 %   Lifetime risk: 7.9 %            A: BCCCP exam without pap smear No complaints.  P: Referred patient to the [facility] for a left breast diagnostic mammogram per recommendation. Appointment scheduled Thursday, February 05, 2022 at 1510.  Loletta Parish, RN 02/03/2022 9:26 AM

## 2022-02-03 NOTE — Patient Instructions (Addendum)
Explained breast self awareness with [facility]. Patient did not need a Pap smear today due to last Pap smear and HPV typing was 12/09/2020. Let her know BCCCP will cover Pap smears and HPV typing every 5 years unless has a history of abnormal Pap smears. Referred patient to the [facility] for a left breast diagnostic mammogram per recommendation. Appointment scheduled Thursday, February 05, 2022 at 1510. Patient aware of appointment and will be there. [facility] verbalized understanding.  Cyril Woodmansee, Arvil Chaco, RN 9:26 AM

## 2022-02-05 ENCOUNTER — Ambulatory Visit
Admission: RE | Admit: 2022-02-05 | Discharge: 2022-02-05 | Disposition: A | Discharge status: Home / Self Care | Payer: No Typology Code available for payment source | Source: Ambulatory Visit | Attending: Obstetrics & Gynecology | Admitting: Obstetrics & Gynecology

## 2022-02-05 DIAGNOSIS — R928 Other abnormal and inconclusive findings on diagnostic imaging of breast: Secondary | ICD-10-CM

## 2023-03-23 ENCOUNTER — Other Ambulatory Visit: Payer: Self-pay | Admitting: Obstetrics & Gynecology

## 2023-03-23 DIAGNOSIS — Z1231 Encounter for screening mammogram for malignant neoplasm of breast: Secondary | ICD-10-CM

## 2023-04-29 ENCOUNTER — Ambulatory Visit
Admission: RE | Admit: 2023-04-29 | Discharge: 2023-04-29 | Disposition: A | Discharge status: Home / Self Care | Payer: No Typology Code available for payment source | Source: Ambulatory Visit | Attending: Obstetrics & Gynecology | Admitting: Obstetrics & Gynecology

## 2023-04-29 DIAGNOSIS — Z1231 Encounter for screening mammogram for malignant neoplasm of breast: Secondary | ICD-10-CM

## 2023-05-05 ENCOUNTER — Encounter: Payer: Self-pay | Admitting: Obstetrics & Gynecology

## 2023-06-16 ENCOUNTER — Encounter: Payer: Self-pay | Admitting: Podiatry

## 2023-06-16 ENCOUNTER — Ambulatory Visit (INDEPENDENT_AMBULATORY_CARE_PROVIDER_SITE_OTHER)

## 2023-06-16 ENCOUNTER — Ambulatory Visit (INDEPENDENT_AMBULATORY_CARE_PROVIDER_SITE_OTHER): Payer: Self-pay | Admitting: Podiatry

## 2023-06-16 DIAGNOSIS — M67471 Ganglion, right ankle and foot: Secondary | ICD-10-CM | POA: Diagnosis not present

## 2023-06-16 DIAGNOSIS — M7751 Other enthesopathy of right foot: Secondary | ICD-10-CM | POA: Diagnosis not present

## 2023-06-16 NOTE — Progress Notes (Signed)
  Subjective:  Patient ID: Brittney Floyd, female    DOB: [birth year],   MRN: [id number]  No chief complaint on file.   43 y.o. female presents for concern of a lesion on the top of her right foot that has been present for several months. Relates it has been painful. She relates sometimes it is not but when it is she takes ibuprofen and that does help.  . Denies any other pedal complaints. Denies n/v/f/c.   Past Medical History:  Diagnosis Date   Anemia    Chlamydia    Cholestasis during pregnancy     Objective:  Physical Exam: Vascular: DP/PT pulses 2/4 bilateral. CFT <3 seconds. Normal hair growth on digits. No edema.  Skin. No lacerations or abrasions bilateral feet. Right dorsal foot soft tissue mass. Fluctuant and tender. Non mobile.  Musculoskeletal: MMT 5/5 bilateral lower extremities in DF, PF, Inversion and Eversion. Deceased ROM in DF of ankle joint.  Neurological: Sensation intact to light touch.   Assessment:   1. Ganglion cyst of right foot      Plan:  Patient was evaluated and treated and all questions answered. X-rays reviewed and discussed with patient. Discussed ganglion cysts and treatment options with the patient. Patient elected to go aspiration of the cyst today.  Procedure note below. Advised patient on postprocedure protocol. Patient to follow-up in 2 weeks or sooner if symptoms worsen or fail to improve.    Procedure: Aspiration cyst, dorsum of right foot  Discussed alternatives, risks, complications and verbal consent was obtained.  Location: dorsal right foot Skin Prep: Alcohol. Injectate: 3 cc 1 % lidocaine.  Aspirated cyst with 18 gauge needle, about 3 cc of gel like viscous fluid aspirated consistent with ganglion cyst Disposition: Patient tolerated procedure well. Injection site dressed with a band-aid. Compression bandage applied.  Post-procedure care was discussed and return precautions discussed.    Louann Sjogren, DPM

## 2023-06-30 ENCOUNTER — Encounter: Payer: Self-pay | Admitting: Podiatry

## 2023-06-30 ENCOUNTER — Ambulatory Visit (INDEPENDENT_AMBULATORY_CARE_PROVIDER_SITE_OTHER): Admitting: Podiatry

## 2023-06-30 DIAGNOSIS — M67471 Ganglion, right ankle and foot: Secondary | ICD-10-CM

## 2023-06-30 NOTE — Progress Notes (Signed)
  Subjective:  Patient ID: Brittney Floyd, female    DOB: 19-Jan-1981,   MRN: [id number]  No chief complaint on file.   43 y.o. female presents for follow-up of right foot ganglion cyst and aspiration. Relates doing well and no pain. Denies any other pedal complaints. Denies n/v/f/c.   Past Medical History:  Diagnosis Date   Anemia    Chlamydia    Cholestasis during pregnancy     Objective:  Physical Exam: Vascular: DP/PT pulses 2/4 bilateral. CFT <3 seconds. Normal hair growth on digits. No edema.  Skin. No lacerations or abrasions bilateral feet. Right dorsal foot soft tissue mass resolved. No reoccurance noted.  Musculoskeletal: MMT 5/5 bilateral lower extremities in DF, PF, Inversion and Eversion. Deceased ROM in DF of ankle joint.  Neurological: Sensation intact to light touch.   Assessment:   1. Ganglion cyst of right foot      Plan:  Patient was evaluated and treated and all questions answered. X-rays reviewed and discussed with patient. Discussed ganglion cysts and treatment options with the patient. Doing well post aspiration.  No return of cyst.  Advised to return if return of pain or cyst that is bothersome in the future.        Louann Sjogren, DPM

## 2023-09-15 ENCOUNTER — Encounter: Payer: Self-pay | Admitting: Podiatry

## 2023-09-15 ENCOUNTER — Ambulatory Visit (INDEPENDENT_AMBULATORY_CARE_PROVIDER_SITE_OTHER)

## 2023-09-15 ENCOUNTER — Ambulatory Visit (INDEPENDENT_AMBULATORY_CARE_PROVIDER_SITE_OTHER): Admitting: Podiatry

## 2023-09-15 DIAGNOSIS — M67471 Ganglion, right ankle and foot: Secondary | ICD-10-CM

## 2023-09-15 DIAGNOSIS — M7751 Other enthesopathy of right foot: Secondary | ICD-10-CM | POA: Diagnosis not present

## 2023-09-15 NOTE — Progress Notes (Signed)
  Subjective:  Patient ID: Brittney Floyd, female    DOB: January 18, 1981,   MRN: [id number]  Chief Complaint  Patient presents with   Foot Pain    RM#23 Patient presents today with right foot pain not any better after last visit pain relieved itself for about 3 weeks bump on top of foot still hurting no change in size. Pain worsening in the past month no relief.    43 y.o. female presents for follow-up of cyst on right foot relates it has returned and pain has returned and has been worsening  over the past month.  . Denies any other pedal complaints. Denies n/v/f/c.   Past Medical History:  Diagnosis Date   Anemia    Chlamydia    Cholestasis during pregnancy     Objective:  Physical Exam: Vascular: DP/PT pulses 2/4 bilateral. CFT <3 seconds. Normal hair growth on digits. No edema.  Skin. No lacerations or abrasions bilateral feet. Right dorsal foot soft tissue mass. Fluctuant and tender. Non mobile.  Musculoskeletal: MMT 5/5 bilateral lower extremities in DF, PF, Inversion and Eversion. Deceased ROM in DF of ankle joint.  Neurological: Sensation intact to light touch.   Assessment:   1. Ganglion cyst of right foot      Plan:  Patient was evaluated and treated and all questions answered. X-rays reviewed and discussed with patient. No acute fractures or dislocations noted. No abnormalities noted.  Discussed ganglion cysts and treatment options with the patient. At this point either we aspirate again or discuss surgical removal. Patient would like to proceed with surgical removal. We will order MRI first to further evaluate extent and then will return to discuss surgery further.  Patient to follow-up after MRI.     Jennefer Moats, DPM
